# Patient Record
Sex: Female | Born: 1980 | Race: White | Hispanic: No | Marital: Single | Smoking: Current every day smoker
Health system: Southern US, Community
[De-identification: ages and names within clinical notes are randomized; demographics above are authoritative.]

## PROBLEM LIST (undated history)

## (undated) DIAGNOSIS — F419 Anxiety disorder, unspecified: Secondary | ICD-10-CM

## (undated) DIAGNOSIS — F32A Depression, unspecified: Secondary | ICD-10-CM

## (undated) DIAGNOSIS — N83209 Unspecified ovarian cyst, unspecified side: Secondary | ICD-10-CM

## (undated) DIAGNOSIS — F988 Other specified behavioral and emotional disorders with onset usually occurring in childhood and adolescence: Secondary | ICD-10-CM

## (undated) DIAGNOSIS — F329 Major depressive disorder, single episode, unspecified: Secondary | ICD-10-CM

## (undated) HISTORY — PX: APPENDECTOMY: SHX54

## (undated) HISTORY — PX: CERVICAL BIOPSY  W/ LOOP ELECTRODE EXCISION: SUR135

---

## 2007-09-17 ENCOUNTER — Inpatient Hospital Stay: Payer: Self-pay | Admitting: General Surgery

## 2007-12-08 ENCOUNTER — Emergency Department: Payer: Self-pay | Admitting: Emergency Medicine

## 2008-04-25 ENCOUNTER — Emergency Department: Payer: Self-pay | Admitting: Emergency Medicine

## 2015-09-06 ENCOUNTER — Encounter: Payer: Self-pay | Admitting: Emergency Medicine

## 2015-09-06 ENCOUNTER — Emergency Department: Payer: Self-pay

## 2015-09-06 ENCOUNTER — Emergency Department
Admission: EM | Admit: 2015-09-06 | Discharge: 2015-09-06 | Disposition: A | Discharge status: Home / Self Care | Payer: Self-pay | Attending: Emergency Medicine | Admitting: Emergency Medicine

## 2015-09-06 DIAGNOSIS — Z720 Tobacco use: Secondary | ICD-10-CM | POA: Insufficient documentation

## 2015-09-06 DIAGNOSIS — R0789 Other chest pain: Secondary | ICD-10-CM | POA: Insufficient documentation

## 2015-09-06 DIAGNOSIS — R079 Chest pain, unspecified: Secondary | ICD-10-CM

## 2015-09-06 DIAGNOSIS — R0602 Shortness of breath: Secondary | ICD-10-CM | POA: Insufficient documentation

## 2015-09-06 LAB — BASIC METABOLIC PANEL
BUN: 13 mg/dL (ref 6–20)
CALCIUM: 9.3 mg/dL (ref 8.9–10.3)
CHLORIDE: 104 mmol/L (ref 101–111)
CO2: 25 mmol/L (ref 22–32)
CREATININE: 0.64 mg/dL (ref 0.44–1.00)
GFR calc Af Amer: >60 mL/min (ref >60)
GFR calc non Af Amer: >60 mL/min (ref >60)
Glucose, Bld: 89 mg/dL (ref 65–99)
Potassium: 4.5 mmol/L (ref 3.5–5.1)
SODIUM: UNDETERMINED mmol/L (ref 135–145)

## 2015-09-06 LAB — CBC
HCT: 39.5 % (ref 35.0–47.0)
Hemoglobin: 14.2 g/dL (ref 12.0–16.0)
MCH: 35.4 pg — AB (ref 26.0–34.0)
MCHC: 35.8 g/dL (ref 32.0–36.0)
MCV: 98.8 fL (ref 80.0–100.0)
PLATELETS: 249 10*3/uL (ref 150–440)
RBC: 4 MIL/uL (ref 3.80–5.20)
RDW: 12.7 % (ref 11.5–14.5)
WBC: 8.6 10*3/uL (ref 3.6–11.0)

## 2015-09-06 LAB — TROPONIN I: Troponin I: <0.03 ng/mL (ref <0.031)

## 2015-09-06 NOTE — ED Notes (Signed)
She is to be discharged to home

## 2015-09-06 NOTE — ED Notes (Signed)
SAYS CHEST PAIN WORSE YESTERDAY PRESSURE.  TODAY IT IS STILL THERE AND IT CAUSES SOB.

## 2015-09-06 NOTE — ED Provider Notes (Signed)
[facility] Emergency Department Provider Note     Time seen: ----------------------------------------- 11:25 AM on 09/06/2015 -----------------------------------------    I have reviewed the triage vital signs and the nursing notes.   HISTORY  Chief Complaint Chest Pain    HPI Christine Allison is a 34 y.o. female who presents ER for chest pain is worse since yesterday. Describes the pain as pressure, today the pain is still there and it caused shortness of breath.She has had associated nausea. Symptoms occurred while she was at work today, last episode was 2 weeks ago. Prior to that she has never had any chest pain or pressure before. She denies any recent illness. Nothing seems to make it better, landed flat makes her symptoms worse.   History reviewed. No pertinent past medical history.  There are no active problems to display for this patient.   No past surgical history on file.  Allergies Review of patient's allergies indicates no known allergies.  Social History Social History  Substance Use Topics  . Smoking status: Current Every Day Smoker  . Smokeless tobacco: None  . Alcohol Use: Yes    Review of Systems Constitutional: Negative for fever. Eyes: Negative for visual changes. ENT: Negative for sore throat. Cardiovascular: Positive for chest pain Respiratory: Positive for shortness of breath Gastrointestinal: Negative for abdominal pain, vomiting and diarrhea. Genitourinary: Negative for dysuria. Musculoskeletal: Negative for back pain. Skin: Negative for rash. Neurological: Negative for headaches, focal weakness or numbness.  10-point ROS otherwise negative.  ____________________________________________   PHYSICAL EXAM:  VITAL SIGNS: ED Triage Vitals  Enc Vitals Group     BP 09/06/15 1023 122/71 mmHg     Pulse Rate 09/06/15 1023 81     Resp 09/06/15 1023 16     Temp 09/06/15 1023 98.1 F (36.7 C)     Temp Source  09/06/15 1023 Oral     SpO2 09/06/15 1023 97 %     Weight 09/06/15 1023 160 lb (72.576 kg)     Height 09/06/15 1023  (1.702 m)     Head Cir --      Peak Flow --      Pain Score 09/06/15 1024 4     Pain Loc --      Pain Edu? --      Excl. in GC? --     Constitutional: Alert and oriented. Well appearing and in no distress. Eyes: Conjunctivae are normal. PERRL. Normal extraocular movements. ENT   Head: Normocephalic and atraumatic.   Nose: No congestion/rhinnorhea.   Mouth/Throat: Mucous membranes are moist.   Neck: No stridor. Cardiovascular: Normal rate, regular rhythm. Normal and symmetric distal pulses are present in all extremities. No murmurs, rubs, or gallops. Respiratory: Normal respiratory effort without tachypnea nor retractions. Breath sounds are clear and equal bilaterally. No wheezes/rales/rhonchi. Gastrointestinal: Soft and nontender. No distention. No abdominal bruits.  Musculoskeletal: Nontender with normal range of motion in all extremities. No joint effusions.  No lower extremity tenderness nor edema. Neurologic:  Normal speech and language. No gross focal neurologic deficits are appreciated. Speech is normal. No gait instability. Skin:  Skin is warm, dry and intact. No rash noted. Psychiatric: Mood and affect are normal. Speech and behavior are normal. Patient exhibits appropriate insight and judgment. ____________________________________________  EKG: Interpreted by me. Sinus rhythm with normal axis normal intervals. No evidence of hypertrophy or acute infarction. Rate is 80 bpm  ____________________________________________  ED COURSE:  Pertinent labs & imaging results that were available  during my care of the patient were reviewed by me and considered in my medical decision making (see chart for details).  ____________________________________________    LABS (pertinent positives/negatives)  Labs Reviewed  CBC - Abnormal; Notable for the  following:    MCH 35.4 (*)    All other components within normal limits  BASIC METABOLIC PANEL  TROPONIN I    RADIOLOGY Chest x-ray is unremarkable  ____________________________________________  FINAL ASSESSMENT AND PLAN  Chest pain  Plan: Patient with labs and imaging as dictated above. No clear explanation for her symptoms, she is stable for outpatient follow-up with her doctor. Low risk for ACS.   Emily Filbert, MD   Emily Filbert, MD 09/06/15 [phone]

## 2015-09-06 NOTE — Discharge Instructions (Signed)

## 2015-09-06 NOTE — ED Notes (Signed)
I have reviewed the discharge instructions with her and she verbalized agreement and understanding

## 2015-11-04 ENCOUNTER — Emergency Department
Admission: EM | Admit: 2015-11-04 | Discharge: 2015-11-04 | Disposition: A | Discharge status: Home / Self Care | Payer: Self-pay | Attending: Emergency Medicine | Admitting: Emergency Medicine

## 2015-11-04 ENCOUNTER — Encounter: Payer: Self-pay | Admitting: Emergency Medicine

## 2015-11-04 DIAGNOSIS — Y990 Civilian activity done for income or pay: Secondary | ICD-10-CM | POA: Insufficient documentation

## 2015-11-04 DIAGNOSIS — S61012A Laceration without foreign body of left thumb without damage to nail, initial encounter: Secondary | ICD-10-CM

## 2015-11-04 DIAGNOSIS — W260XXA Contact with knife, initial encounter: Secondary | ICD-10-CM | POA: Insufficient documentation

## 2015-11-04 DIAGNOSIS — Y9389 Activity, other specified: Secondary | ICD-10-CM | POA: Insufficient documentation

## 2015-11-04 DIAGNOSIS — Z23 Encounter for immunization: Secondary | ICD-10-CM | POA: Insufficient documentation

## 2015-11-04 DIAGNOSIS — F17200 Nicotine dependence, unspecified, uncomplicated: Secondary | ICD-10-CM | POA: Insufficient documentation

## 2015-11-04 DIAGNOSIS — Y9289 Other specified places as the place of occurrence of the external cause: Secondary | ICD-10-CM | POA: Insufficient documentation

## 2015-11-04 DIAGNOSIS — S61112A Laceration without foreign body of left thumb with damage to nail, initial encounter: Secondary | ICD-10-CM | POA: Insufficient documentation

## 2015-11-04 HISTORY — DX: Depression, unspecified: F32.A

## 2015-11-04 HISTORY — DX: Anxiety disorder, unspecified: F41.9

## 2015-11-04 HISTORY — DX: Major depressive disorder, single episode, unspecified: F32.9

## 2015-11-04 HISTORY — DX: Unspecified ovarian cyst, unspecified side: N83.209

## 2015-11-04 MED ORDER — TETANUS-DIPHTH-ACELL PERTUSSIS 5-2.5-18.5 LF-MCG/0.5 IM SUSP
0.5000 mL | Freq: Once | INTRAMUSCULAR | Status: AC
  Administered 2015-11-04: 0.5 mL via INTRAMUSCULAR
  Filled 2015-11-04: qty 0.5

## 2015-11-04 NOTE — Discharge Instructions (Signed)
Wound Care °Taking care of your wound properly can help to prevent pain and infection. It can also help your wound to heal more quickly.  °HOW TO CARE FOR YOUR WOUND  °· Take or apply over-the-counter and prescription medicines only as told by your health care provider. °· If you were prescribed antibiotic medicine, take or apply it as told by your health care provider. Do not stop using the antibiotic even if your condition improves. °· Clean the wound each day or as told by your health care provider. °¨ Wash the wound with mild soap and water. °¨ Rinse the wound with water to remove all soap. °¨ Pat the wound dry with a clean towel. Do not rub it. °· There are many different ways to close and cover a wound. For example, a wound can be covered with stitches (sutures), skin glue, or adhesive strips. Follow instructions from your health care provider about: °¨ How to take care of your wound. °¨ When and how you should change your bandage (dressing). °¨ When you should remove your dressing. °¨ Removing whatever was used to close your wound. °· Check your wound every day for signs of infection. Watch for: °¨ Redness, swelling, or pain. °¨ Fluid, blood, or pus. °· Keep the dressing dry until your health care provider says it can be removed. Do not take baths, swim, use a hot tub, or do anything that would put your wound underwater until your health care provider approves. °· Raise (elevate) the injured area above the level of your heart while you are sitting or lying down. °· Do not scratch or pick at the wound. °· Keep all follow-up visits as told by your health care provider. This is important. °SEEK MEDICAL CARE IF: °· You received a tetanus shot and you have swelling, severe pain, redness, or bleeding at the injection site. °· You have a fever. °· Your pain is not controlled with medicine. °· You have increased redness, swelling, or pain at the site of your wound. °· You have fluid, blood, or pus coming from your  wound. °· You notice a bad smell coming from your wound or your dressing. °SEEK IMMEDIATE MEDICAL CARE IF: °· You have a red streak going away from your wound. °  °This information is not intended to replace advice given to you by your health care provider. Make sure you discuss any questions you have with your health care provider. °  °Document Released: 09/09/2008 Document Revised: 04/17/2015 Document Reviewed: 11/27/2014 °Elsevier Interactive Patient Education ©2016 Elsevier Inc. ° °

## 2015-11-04 NOTE — ED Notes (Signed)
Pt was at work and was cutting broccoli and sliced left thumb. Pt works for [employer] and spoke with Nick SwazilandJordan @ [phone] and he denies need for UDS at this time. Pt does want to file this as a workman's comp claim.

## 2015-11-04 NOTE — ED Provider Notes (Signed)
[facility] Emergency Department Provider Note  ____________________________________________  Time seen: 2025  I have reviewed the triage vital signs and the nursing notes.   HISTORY  Chief Complaint Laceration     HPI Christine Allison is a 34 y.o. female who was cutting broccoli at work. The knife sliced through a very small portion of her distal left thumb. This small area, approximately 4 mm in diameter, cut a small circle off. She has ongoing discomfort and pain. She reports that the blood was slow to stop. There is no bleeding at this time. She does not recall when her last tetanus shot was and she is requesting a tetanus shot now.   Past Medical History  Diagnosis Date  . Ovarian cyst   . Anxiety   . Depression     There are no active problems to display for this patient.   Past Surgical History  Procedure Laterality Date  . Appendectomy      No current outpatient prescriptions on file.  Allergies Review of patient's allergies indicates no known allergies.  History reviewed. No pertinent family history.  Social History Social History  Substance Use Topics  . Smoking status: Current Every Day Smoker  . Smokeless tobacco: None  . Alcohol Use: Yes    Review of Systems  Constitutional: Negative for fatigue. ENT: Negative for congestion. Cardiovascular: Negative for chest pain. Respiratory: Negative for cough. Gastrointestinal: Negative for abdominal pain, vomiting and diarrhea. Genitourinary: Negative for dysuria. Musculoskeletal: No myalgias or injuries. Skin: Laceration left thumb. See history of present illness. Neurological: Negative for headache or focal weakness   10-point ROS otherwise negative.  ____________________________________________   PHYSICAL EXAM:  VITAL SIGNS: ED Triage Vitals  Enc Vitals Group     BP 11/04/15 1959 131/71 mmHg     Pulse Rate 11/04/15 1959 87     Resp 11/04/15 1959 18     Temp 11/04/15  1959 98.2 F (36.8 C)     Temp Source 11/04/15 1959 Oral     SpO2 11/04/15 1959 97 %     Weight 11/04/15 1959 161 lb (73.029 kg)     Height 11/04/15 1959 5\' 7"  (1.702 m)     Head Cir --      Peak Flow --      Pain Score 11/04/15 1955 4     Pain Loc --      Pain Edu? --      Excl. in GC? --     Constitutional:  Alert and oriented. Pleasant and interactive. Some discomfort with the left thumb. ENT   Head: Normocephalic and atraumatic. Cardiovascular: Normal rate, regular rhythm, no murmur noted Respiratory:  Normal respiratory effort, no tachypnea.    Breath sounds are clear and equal bilaterally.  Musculoskeletal: No deformity noted. Nontender with normal range of motion in all extremities, including left thumb.  No noted edema. Neurologic:  Communicative. Normal appearing spontaneous movement in all 4 extremities. No gross focal neurologic deficits are appreciated.  Skin:  Skin is warm, dry. There is a 4 mm small circular excision of dermis on the distal portion of the left thumb on the lateral edge by the nail. At this time, the bleeding is controlled. The rest of the thumb did have a thin layer of dried blood on it which we have now cleaned off. Psychiatric: Mood and affect are normal. Speech and behavior are normal.  ____________________________________________   INITIAL IMPRESSION / ASSESSMENT AND PLAN / ED COURSE  Pertinent  labs & imaging results that were available during my care of the patient were reviewed by me and considered in my medical decision making (see chart for details).  34 year old female with a small circular excision of dermal tissue. Her bleeding was slow to stop prior to arrival, but on exam, the 4 mm excised area looks good. We will treat her with tetanus. I have counseled her on wound care. We will place a dressing on the area.  ____________________________________________   FINAL CLINICAL IMPRESSION(S) / ED DIAGNOSES  Final diagnoses:  Laceration of  left thumb, initial encounter        Darien Ramus, MD 11/04/15 2103

## 2015-11-04 NOTE — ED Notes (Signed)
Pt d/c to home with significant other. Pt is ambulatory and alert at this time. 

## 2016-05-08 ENCOUNTER — Emergency Department
Admission: EM | Admit: 2016-05-08 | Discharge: 2016-05-08 | Disposition: A | Discharge status: Home / Self Care | Payer: Self-pay | Attending: Emergency Medicine | Admitting: Emergency Medicine

## 2016-05-08 ENCOUNTER — Encounter: Payer: Self-pay | Admitting: Emergency Medicine

## 2016-05-08 DIAGNOSIS — F17200 Nicotine dependence, unspecified, uncomplicated: Secondary | ICD-10-CM | POA: Insufficient documentation

## 2016-05-08 DIAGNOSIS — Z79899 Other long term (current) drug therapy: Secondary | ICD-10-CM | POA: Insufficient documentation

## 2016-05-08 DIAGNOSIS — Z760 Encounter for issue of repeat prescription: Secondary | ICD-10-CM | POA: Insufficient documentation

## 2016-05-08 DIAGNOSIS — F329 Major depressive disorder, single episode, unspecified: Secondary | ICD-10-CM | POA: Insufficient documentation

## 2016-05-08 MED ORDER — DULOXETINE HCL 60 MG PO CPEP: 60.0000 mg | ORAL_CAPSULE | Freq: Every day | ORAL | Status: DC

## 2016-05-08 NOTE — ED Notes (Signed)
See triage note  States she is out of both meds  But states she needed cymbalta refilled if possible   Was told to come to ED per [facility]

## 2016-05-08 NOTE — ED Provider Notes (Signed)
[facility] Emergency Department Provider Note ____________________________________________  Time seen: 1738  I have reviewed the triage vital signs and the nursing notes.  HISTORY  Chief Complaint  Medication Refill  HPI Christine Allison is a 35 y.o. female presents to the ED for refill of her Cymbalta. She describes that she is followed by our [facility]for monthly refills. She has now dosed her last pill yesterday, and was referred to the ED when the called [facility] for a refill. She denies withdrawal symptoms, suicidal or homicidal ideations. She is [facility].   Past Medical History  Diagnosis Date  . Ovarian cyst   . Anxiety   . Depression     There are no active problems to display for this patient.   Past Surgical History  Procedure Laterality Date  . Appendectomy      Current Outpatient Rx  Name  Route  Sig  Dispense  Refill  . DULoxetine (CYMBALTA) 60 MG capsule   Oral   Take 60 mg by mouth daily.         . methylphenidate (RITALIN) 5 MG tablet   Oral   Take 15 mg by mouth 2 (two) times daily with breakfast and lunch.         . DULoxetine (CYMBALTA) 60 MG capsule   Oral   Take 1 capsule (60 mg total) by mouth daily.   7 capsule   0     Allergies Review of patient's allergies indicates no known allergies.  History reviewed. No pertinent family history.  Social History Social History  Substance Use Topics  . Smoking status: Current Every Day Smoker  . Smokeless tobacco: Never Used  . Alcohol Use: Yes    Review of Systems  Constitutional: Negative for fever. Cardiovascular: Negative for chest pain. Respiratory: Negative for shortness of breath. Gastrointestinal: Negative for abdominal pain, vomiting and diarrhea. Neurological: Negative for headaches, focal weakness or numbness. ____________________________________________  PHYSICAL EXAM:  VITAL SIGNS: ED Triage Vitals   Enc Vitals Group     BP 05/08/16 1712 116/62 mmHg     Pulse Rate 05/08/16 1712 82     Resp 05/08/16 1712 18     Temp 05/08/16 1712 98.1 F (36.7 C)     Temp Source 05/08/16 1712 Oral     SpO2 05/08/16 1712 98 %     Weight 05/08/16 1712 166 lb (75.297 kg)     Height 05/08/16 1712 5\' 7"  (1.702 m)     Head Cir --      Peak Flow --      Pain Score --      Pain Loc --      Pain Edu? --      Excl. in GC? --    Constitutional: Alert and oriented. Well appearing and in no distress. Head: Normocephalic and atraumatic. Eyes: Conjunctivae are normal. PERRL. Normal extraocular movements Cardiovascular: Normal rate, regular rhythm.  Respiratory: Normal respiratory effort. No wheezes/rales/rhonchi. Musculoskeletal: Nontender with normal range of motion in all extremities.  Neurologic:  Normal gait without ataxia. Normal speech and language. No gross focal neurologic deficits are appreciated. Skin:  Skin is warm, dry and intact. No rash noted. Psychiatric: Mood and affect are normal. Patient exhibits appropriate insight and judgment. ____________________________________________  INITIAL IMPRESSION / ASSESSMENT AND PLAN / ED COURSE  Patient with a refill request for her daily Cymbalta. She is between appointments as [facility] She will  be provided with #7 Cymbalta 60 mg CR tabs. She will follow-up with [facility] on May 30th as scheduled.  ____________________________________________  FINAL CLINICAL IMPRESSION(S) / ED DIAGNOSES  Final diagnoses:  Medication refill     Lissa Hoard, PA-C 05/08/16 1756  Phineas Semen, MD 05/09/16 [phone]

## 2016-05-08 NOTE — ED Notes (Addendum)
Pt presents to ED for Cymbalta 60mg . Pt states she had her last dose yesterday morning at approx 0600. Pt c/o anxiety, dizziness, vertigo. Pt states was told to come in by [facility] due to "withdrawal from her medication". Pt in NAD at this time, pt calm and cooperative, respirations even and unlabored a this time.

## 2016-05-08 NOTE — Discharge Instructions (Signed)
Medicine Refill at the Emergency Department We have refilled your medicine today, but it is best for you to get refills through your primary health care provider's office. In the future, please plan ahead so you do not need to get refills from the emergency department. If the medicine we refilled was a maintenance medicine, you may have received only enough to get you by until you are able to see your regular health care provider.   This information is not intended to replace advice given to you by your health care provider. Make sure you discuss any questions you have with your health care provider.   Document Released: 03/19/2004 Document Revised: 12/22/2014 Document Reviewed: 03/10/2014 Elsevier Interactive Patient Education 2016 [employer]  You should follow-up with [facility] For your medicine refill as scheduled.

## 2016-05-29 ENCOUNTER — Telehealth: Payer: Self-pay | Admitting: *Deleted

## 2016-05-29 NOTE — Telephone Encounter (Signed)
Received referral from Dr. Cain Saupeammie Fulp for spontaneous ecchymosis. [place] in [place]. Left VM to call HIM to discuss if she prefers to [facility]. No labs since 09/06/15 and no office note from referring provider. Left VM for [facility] at [facility] requesting records-office notes/labs.

## 2016-05-29 NOTE — Telephone Encounter (Addendum)
Called patient back and made her aware that she was contacted in error-wrong [employer]elly Allison as well as [facility] to retract request for records.

## 2016-06-28 ENCOUNTER — Emergency Department
Admission: EM | Admit: 2016-06-28 | Discharge: 2016-06-28 | Disposition: A | Discharge status: Home / Self Care | Payer: Self-pay | Attending: Emergency Medicine | Admitting: Emergency Medicine

## 2016-06-28 ENCOUNTER — Encounter: Payer: Self-pay | Admitting: Emergency Medicine

## 2016-06-28 DIAGNOSIS — S61519A Laceration without foreign body of unspecified wrist, initial encounter: Secondary | ICD-10-CM

## 2016-06-28 DIAGNOSIS — S51811A Laceration without foreign body of right forearm, initial encounter: Secondary | ICD-10-CM | POA: Insufficient documentation

## 2016-06-28 DIAGNOSIS — Y999 Unspecified external cause status: Secondary | ICD-10-CM | POA: Insufficient documentation

## 2016-06-28 DIAGNOSIS — S51812A Laceration without foreign body of left forearm, initial encounter: Secondary | ICD-10-CM | POA: Insufficient documentation

## 2016-06-28 DIAGNOSIS — Z79899 Other long term (current) drug therapy: Secondary | ICD-10-CM | POA: Insufficient documentation

## 2016-06-28 DIAGNOSIS — Z7289 Other problems related to lifestyle: Secondary | ICD-10-CM

## 2016-06-28 DIAGNOSIS — X789XXA Intentional self-harm by unspecified sharp object, initial encounter: Secondary | ICD-10-CM

## 2016-06-28 DIAGNOSIS — X788XXA Intentional self-harm by other sharp object, initial encounter: Secondary | ICD-10-CM | POA: Insufficient documentation

## 2016-06-28 DIAGNOSIS — Y92009 Unspecified place in unspecified non-institutional (private) residence as the place of occurrence of the external cause: Secondary | ICD-10-CM | POA: Insufficient documentation

## 2016-06-28 DIAGNOSIS — F1010 Alcohol abuse, uncomplicated: Secondary | ICD-10-CM

## 2016-06-28 DIAGNOSIS — F329 Major depressive disorder, single episode, unspecified: Secondary | ICD-10-CM | POA: Insufficient documentation

## 2016-06-28 DIAGNOSIS — F17210 Nicotine dependence, cigarettes, uncomplicated: Secondary | ICD-10-CM | POA: Insufficient documentation

## 2016-06-28 DIAGNOSIS — F32A Depression, unspecified: Secondary | ICD-10-CM

## 2016-06-28 DIAGNOSIS — F3131 Bipolar disorder, current episode depressed, mild: Secondary | ICD-10-CM

## 2016-06-28 DIAGNOSIS — Y939 Activity, unspecified: Secondary | ICD-10-CM | POA: Insufficient documentation

## 2016-06-28 DIAGNOSIS — F319 Bipolar disorder, unspecified: Secondary | ICD-10-CM

## 2016-06-28 LAB — CBC WITH DIFFERENTIAL/PLATELET
BASOS ABS: 0.1 10*3/uL (ref 0–0.1)
Basophils Relative: 1 %
Eosinophils Absolute: 0.5 10*3/uL (ref 0–0.7)
HCT: 42 % (ref 35.0–47.0)
HEMOGLOBIN: 15 g/dL (ref 12.0–16.0)
Lymphs Abs: 3.3 10*3/uL (ref 1.0–3.6)
MCH: 34.6 pg — ABNORMAL HIGH (ref 26.0–34.0)
MCHC: 35.8 g/dL (ref 32.0–36.0)
MCV: 96.6 fL (ref 80.0–100.0)
Monocytes Absolute: 0.9 10*3/uL (ref 0.2–0.9)
Monocytes Relative: 8 %
NEUTROS ABS: 6.7 10*3/uL — AB (ref 1.4–6.5)
Platelets: 252 10*3/uL (ref 150–440)
RBC: 4.35 MIL/uL (ref 3.80–5.20)
RDW: 12.9 % (ref 11.5–14.5)
WBC: 11.4 10*3/uL — AB (ref 3.6–11.0)

## 2016-06-28 LAB — URINE DRUG SCREEN, QUALITATIVE (ARMC ONLY)
AMPHETAMINES, UR SCREEN: NOT DETECTED
BENZODIAZEPINE, UR SCRN: NOT DETECTED
Barbiturates, Ur Screen: NOT DETECTED
Cannabinoid 50 Ng, Ur ~~LOC~~: NOT DETECTED
Cocaine Metabolite,Ur ~~LOC~~: NOT DETECTED
MDMA (ECSTASY) UR SCREEN: NOT DETECTED
METHADONE SCREEN, URINE: NOT DETECTED
Opiate, Ur Screen: NOT DETECTED
PHENCYCLIDINE (PCP) UR S: NOT DETECTED
TRICYCLIC, UR SCREEN: NOT DETECTED

## 2016-06-28 LAB — COMPREHENSIVE METABOLIC PANEL
ALT: 55 U/L — ABNORMAL HIGH (ref 14–54)
ANION GAP: 9 (ref 5–15)
AST: 40 U/L (ref 15–41)
Albumin: 4.4 g/dL (ref 3.5–5.0)
Alkaline Phosphatase: 81 U/L (ref 38–126)
BUN: 12 mg/dL (ref 6–20)
CHLORIDE: 106 mmol/L (ref 101–111)
CO2: 20 mmol/L — ABNORMAL LOW (ref 22–32)
CREATININE: 0.74 mg/dL (ref 0.44–1.00)
Calcium: 9.3 mg/dL (ref 8.9–10.3)
GFR calc Af Amer: >60 mL/min (ref >60)
Glucose, Bld: 103 mg/dL — ABNORMAL HIGH (ref 65–99)
POTASSIUM: 3.5 mmol/L (ref 3.5–5.1)
SODIUM: 135 mmol/L (ref 135–145)
Total Bilirubin: 0.6 mg/dL (ref 0.3–1.2)
Total Protein: 7.8 g/dL (ref 6.5–8.1)

## 2016-06-28 LAB — POCT PREGNANCY, URINE: PREG TEST UR: NEGATIVE

## 2016-06-28 LAB — SALICYLATE LEVEL: Salicylate Lvl: <4 mg/dL (ref 2.8–30.0)

## 2016-06-28 LAB — PREGNANCY, URINE: PREG TEST UR: NEGATIVE

## 2016-06-28 LAB — ETHANOL: ALCOHOL ETHYL (B): 212 mg/dL — AB (ref <5)

## 2016-06-28 LAB — ACETAMINOPHEN LEVEL

## 2016-06-28 MED ORDER — SODIUM CHLORIDE 0.9 % IV BOLUS (SEPSIS)
1000.0000 mL | Freq: Once | INTRAVENOUS | Status: AC
  Administered 2016-06-28: 1000 mL via INTRAVENOUS

## 2016-06-28 MED ORDER — METHYLPHENIDATE HCL 10 MG PO TABS
15.0000 mg | ORAL_TABLET | Freq: Two times a day (BID) | ORAL | Status: DC
  Administered 2016-06-28: 15 mg via ORAL
  Filled 2016-06-28: qty 2

## 2016-06-28 MED ORDER — DULOXETINE HCL 60 MG PO CPEP
60.0000 mg | ORAL_CAPSULE | Freq: Every day | ORAL | Status: DC
  Administered 2016-06-28: 60 mg via ORAL
  Filled 2016-06-28: qty 1

## 2016-06-28 NOTE — ED Notes (Signed)
TTS at bedside. 

## 2016-06-28 NOTE — Progress Notes (Signed)
Spoke with Nurse [facility] to order cart for telpsych TTS assessment, pt. Nurse notified will wake pt. Up and place cart in room. Pt nurse is Felicia. Patra Gherardi K. Sherlon HandingHarris, LCAS-A, LPC-A, [facility]  Counselor 06/28/2016 7:45 AM

## 2016-06-28 NOTE — Discharge Instructions (Signed)
You have been seen in the Emergency Department (ED)  today for a psychiatric complaint.  You have been evaluated by psychiatry and we believe you are safe to be discharged from the hospital.   ° °Please return to the Emergency Department (ED)  immediately if you have ANY thoughts of hurting yourself or anyone else, so that we may help you. ° °Please avoid alcohol and drug use. ° °Follow up with your doctor and/or therapist as soon as possible regarding today's ED  visit.  ° °You may call crisis hotline for [place] at [phone]. ° °
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
## 2016-06-28 NOTE — ED Provider Notes (Signed)
[facility] Emergency Department Provider Note   ____________________________________________  Time seen: Approximately 3:30 AM  I have reviewed the triage vital signs and the nursing notes.   HISTORY  Chief Complaint Depression and Alcohol Intoxication    HPI Christine Allison is a 35 y.o. female who presents to the ED from home via EMS with chief complaint of alcohol intoxication and self injurious behavior. Patient has a history of anxiety and depression who reports home stressors this morning which led to her cutting both forearms with a dull razor. Tetanus is up-to-date. Patient reports alcohol use prior to arrival. Denies SI/HI/AH/VH. Voices no medical complaints. Denies recent fever, chills, chest pain, shortness of breath, abdominal pain, nausea, vomiting, diarrhea. Denies recent travel or trauma. Nothing makes her symptoms better or worse.   Past Medical History  Diagnosis Date  . Ovarian cyst   . Anxiety   . Depression     There are no active problems to display for this patient.   Past Surgical History  Procedure Laterality Date  . Appendectomy      Current Outpatient Rx  Name  Route  Sig  Dispense  Refill  . DULoxetine (CYMBALTA) 60 MG capsule   Oral   Take 1 capsule (60 mg total) by mouth daily.   7 capsule   0   . methylphenidate (RITALIN) 5 MG tablet   Oral   Take 15 mg by mouth 2 (two) times daily with breakfast and lunch.           Allergies Review of patient's allergies indicates no known allergies.  History reviewed. No pertinent family history.  Social History Social History  Substance Use Topics  . Smoking status: Current Every Day Smoker -- 0.50 packs/day    Types: Cigarettes  . Smokeless tobacco: Never Used  . Alcohol Use: Yes     Comment: 2-3 beers dailey    Review of Systems  Constitutional: No fever/chills. Eyes: No visual changes. ENT: No sore throat. Cardiovascular: Denies chest  pain. Respiratory: Denies shortness of breath. Gastrointestinal: No abdominal pain.  No nausea, no vomiting.  No diarrhea.  No constipation. Genitourinary: Negative for dysuria. Musculoskeletal: Positive for bilateral forearm lacerations. Negative for back pain. Skin: Negative for rash. Neurological: Negative for headaches, focal weakness or numbness. Psychiatric:Positive for anxiety and depression.  10-point ROS otherwise negative.  ____________________________________________   PHYSICAL EXAM:  VITAL SIGNS: ED Triage Vitals  Enc Vitals Group     BP 06/28/16 0230 119/83 mmHg     Pulse Rate 06/28/16 0230 95     Resp 06/28/16 0230 20     Temp 06/28/16 0230 98.8 F (37.1 C)     Temp Source 06/28/16 0230 Oral     SpO2 06/28/16 0230 97 %     Weight 06/28/16 0230 165 lb (74.844 kg)     Height 06/28/16 0230 5\' 8"  (1.727 m)     Head Cir --      Peak Flow --      Pain Score 06/28/16 0234 2     Pain Loc --      Pain Edu? --      Excl. in GC? --     Constitutional: Asleep, reluctant to awakened for exam. Well appearing and in no acute distress. Eyes: Conjunctivae are normal. PERRL. EOMI. Head: Atraumatic. Nose: No congestion/rhinnorhea. Mouth/Throat: Mucous membranes are moist.  Oropharynx non-erythematous. Neck: No stridor.  No cervical spine tenderness to palpation. Cardiovascular: Normal rate, regular rhythm. Grossly  normal heart sounds.  Good peripheral circulation. Respiratory: Normal respiratory effort.  No retractions. Lungs CTAB. Gastrointestinal: Soft and nontender. No distention. No abdominal bruits. No CVA tenderness. Musculoskeletal: Bilateral forearms: Multiple linear superficial lacerations to both forearms not requiring sutures. Neurologic:  Normal speech and language. No gross focal neurologic deficits are appreciated.  Skin:  Skin is warm, dry and intact. No rash noted. Psychiatric: Mood and affect are flat. Speech and behavior are  flat.  ____________________________________________   LABS (all labs ordered are listed, but only abnormal results are displayed)  Labs Reviewed  COMPREHENSIVE METABOLIC PANEL - Abnormal; Notable for the following:    CO2 20 (*)    Glucose, Bld 103 (*)    ALT 55 (*)    All other components within normal limits  ETHANOL - Abnormal; Notable for the following:    Alcohol, Ethyl (B) 212 (*)    All other components within normal limits  CBC WITH DIFFERENTIAL/PLATELET - Abnormal; Notable for the following:    WBC 11.4 (*)    MCH 34.6 (*)    Neutro Abs 6.7 (*)    All other components within normal limits  ACETAMINOPHEN LEVEL - Abnormal; Notable for the following:    Acetaminophen (Tylenol), Serum <10 (*)    All other components within normal limits  SALICYLATE LEVEL  URINE DRUG SCREEN, QUALITATIVE ([facility] ONLY)  PREGNANCY, URINE  POCT PREGNANCY, URINE   ____________________________________________  EKG  None ____________________________________________  RADIOLOGY  None ____________________________________________   PROCEDURES  Procedure(s) performed: None  Procedures  Critical Care performed: No  ____________________________________________   INITIAL IMPRESSION / ASSESSMENT AND PLAN / ED COURSE  Pertinent labs & imaging results that were available during my care of the patient were reviewed by me and considered in my medical decision making (see chart for details).  35 year old female with a history of anxiety and depression who presents with alcohol intoxication and self injurious behavior. Voices no thoughts of SI/HI/AH/VH. Will initiate IV fluid resuscitation; consult TTS and psychiatry to evaluate patient in the emergency department. ____________________________________________   FINAL CLINICAL IMPRESSION(S) / ED DIAGNOSES  Final diagnoses:  Self-injurious behavior  Depression      NEW MEDICATIONS STARTED DURING THIS VISIT:  New Prescriptions   No  medications on file     Note:  This document was prepared using Dragon voice recognition software and may include unintentional dictation errors.    Irean Hong, MD 06/28/16 [phone]

## 2016-06-28 NOTE — BH Assessment (Signed)
Tele Assessment Note   Christine Allison is an 35 y.o. female, Caucasian, Single who presents to [facility] ED with complaint of stressors at work and home, and ongoing worsening depression. Patient primary concern is of current elevation in anxiety, multiple stressors, and Depression. Patient states that she lives with partner and has had a decline in sleep with normal being up to 10 hours per night, and most recent being 4-6 hours sleep per night.Patient denies hx. Of psychotic symptoms. Pt states that she is currently prescribed Zobolta and Rittalin.   Patient denies current SI, but has lacerations on arms / cuts stating that she dis this as a form of "acting out." Patient acknowledges hx. Of SI with x 1 attempt via overdose in 2013. Patient denies current or past hx. Of HI and AVH. Patient acknowledges hx. Of substance abuse with alcohol with age of start t 89 with daily consumption of unspecified amounts and last consumption on 06-27-16 with unknown amount. Patient acknowledges x 1 inpatient psychiatric admission in 2013 for overdose on pills. Patient acknowledges hx. Of outpatient psychiatric care with [facility] last visit at or around 5-17.  Patient denies hx. Of S.A. Inpatient treatment.   Patient is dressed in scrubs and is alert and oriented x4. Patient speech was within normal limits and motor behavior appeared normal. Patient thought process is coherent. Patient  does not appear to be responding to internal stimuli. Patient was cooperative throughout the assessment and states that she is agreeable to inpatient psychiatric treatment.  Diagnosis: Major Depressive Disorder, Recurrent Episode, Unspecified; Attention- Deficit/ Hyperactive Disorder; Alcohol Use Disorder, Severe  Past Medical History:  Past Medical History  Diagnosis Date  . Ovarian cyst   . Anxiety   . Depression     Past Surgical History  Procedure Laterality Date  . Appendectomy      Family History: History reviewed. No pertinent  family history.  Social History:  reports that she has been smoking Cigarettes.  She has been smoking about 0.50 packs per day. She has never used smokeless tobacco. She reports that she drinks alcohol. She reports that she does not use illicit drugs.  Additional Social History:  Alcohol / Drug Use Pain Medications: SEE MAR Prescriptions: SEE MAR Over the Counter: SEE MAR History of alcohol / drug use?: No history of alcohol / drug abuse Longest period of sobriety (when/how long): pt denies S.A.  CIWA: CIWA-Ar BP: 104/63 mmHg Pulse Rate: 84 Nausea and Vomiting: no nausea and no vomiting Tactile Disturbances: none Tremor: no tremor Auditory Disturbances: not present Paroxysmal Sweats: no sweat visible Visual Disturbances: not present Anxiety: no anxiety, at ease Headache, Fullness in Head: none present Agitation: normal activity Orientation and Clouding of Sensorium: oriented and can do serial additions CIWA-Ar Total: 0 COWS:    PATIENT STRENGTHS: (choose at least two) Active sense of humor Average or above average intelligence Capable of independent living  Allergies: No Known Allergies  Home Medications:  (Not in a hospital admission)  OB/GYN Status:  Patient's last menstrual period was 06/08/2016.  General Assessment Data Location of Assessment: [facility] Assessment Services TTS Assessment: In system Is this a Tele or Face-to-Face Assessment?: Tele Assessment Is this an Initial Assessment or a Re-assessment for this encounter?: Initial Assessment Marital status: Single Maiden name: n/a Is patient pregnant?: No Pregnancy Status: No Living Arrangements: Spouse/significant other Can pt return to current living arrangement?: Yes Admission Status: Voluntary Is patient capable of signing voluntary admission?: Yes Referral Source: Self/Family/Friend Insurance type: SP  Crisis Care Plan Living Arrangements: Spouse/significant other Name of Psychiatrist: [facility] Name of  Therapist: [facility]  Education Status Is patient currently in school?: No Current Grade: n/a Highest grade of school patient has completed: unspecified Name of school: n/a Contact person: Verdene Lennert [phone]  Risk to self with the past 6 months Suicidal Ideation: No Has patient been a risk to self within the past 6 months prior to admission? : No Suicidal Intent: No Has patient had any suicidal intent within the past 6 months prior to admission? : No Is patient at risk for suicide?: No Suicidal Plan?: No Has patient had any suicidal plan within the past 6 months prior to admission? : No Access to Means: No What has been your use of drugs/alcohol within the last 12 months?: alcohol Previous Attempts/Gestures: Yes How many times?: 1 Other Self Harm Risks: recent cut behavior superficial Triggers for Past Attempts: None known Intentional Self Injurious Behavior: Cutting (most recent but denies states was "acting out") Comment - Self Injurious Behavior:  (pt states was "acting out") Family Suicide History: No Recent stressful life event(s): Conflict (Comment) Persecutory voices/beliefs?: No Depression: Yes Depression Symptoms: Despondent, Insomnia, Tearfulness, Isolating, Fatigue, Guilt, Loss of interest in usual pleasures, Feeling worthless/self pity, Feeling angry/irritable Substance abuse history and/or treatment for substance abuse?: Yes Suicide prevention information given to non-admitted patients: Yes  Risk to Others within the past 6 months Homicidal Ideation: No Does patient have any lifetime risk of violence toward others beyond the six months prior to admission? : No Thoughts of Harm to Others: No Current Homicidal Intent: No Current Homicidal Plan: No Access to Homicidal Means: No Identified Victim: n/a History of harm to others?: No Assessment of Violence: None Noted Violent Behavior Description: none noted Does patient have access to weapons?: No Criminal  Charges Pending?: Yes Describe Pending Criminal Charges: pt. on probation for msidemeanor Does patient have a court date: Yes Court Date: 07/11/16 (pt did not specify date ) Is patient on probation?: Yes  Psychosis Hallucinations: None noted Delusions: None noted  Mental Status Report Appearance/Hygiene: Disheveled, In scrubs Eye Contact: Poor Motor Activity: Freedom of movement, Unremarkable Speech: Logical/coherent Level of Consciousness: Alert Mood: Depressed, Anxious, Despair Affect: Anxious, Depressed, Sad Anxiety Level: Moderate Thought Processes: Coherent, Relevant Judgement: Partial Orientation: Person, Place, Time, Situation, Appropriate for developmental age Obsessive Compulsive Thoughts/Behaviors: Moderate  Cognitive Functioning Concentration: Decreased Memory: Recent Intact, Remote Intact IQ: Average Insight: Fair Impulse Control: Poor Appetite: Fair Weight Loss: 0 Weight Gain: 0 Sleep: Decreased Total Hours of Sleep: 6 Vegetative Symptoms: None  ADLScreening [facility] Assessment Services) Patient's cognitive ability adequate to safely complete daily activities?: Yes Patient able to express need for assistance with ADLs?: Yes Independently performs ADLs?: Yes (appropriate for developmental age)  Prior Inpatient Therapy Prior Inpatient Therapy: Yes Prior Therapy Dates: 2013 Prior Therapy Facilty/Provider(s): [facility] Reason for Treatment: overdose  Prior Outpatient Therapy Prior Outpatient Therapy: Yes Prior Therapy Dates: 6-17 Prior Therapy Facilty/Provider(s): [facility] Reason for Treatment: depression/ anxiety Does patient have an ACCT team?: No Does patient have Intensive In-House Services?  : No Does patient have [facility] services? : No Does patient have P4CC services?: No  ADL Screening (condition at time of admission) Patient's cognitive ability adequate to safely complete daily activities?: Yes Is the patient deaf or have difficulty hearing?: No Does  the patient have difficulty seeing, even when wearing glasses/contacts?: Yes (pt states currently does not have glasses but needs them) Does the patient have difficulty concentrating, remembering, or  making decisions?: Yes (hx. ADHD) Patient able to express need for assistance with ADLs?: Yes Does the patient have difficulty dressing or bathing?: No Independently performs ADLs?: Yes (appropriate for developmental age) Does the patient have difficulty walking or climbing stairs?: No Weakness of Legs: None Weakness of Arms/Hands: None  Home Assistive Devices/Equipment Home Assistive Devices/Equipment: None    Abuse/Neglect Assessment (Assessment to be complete while patient is alone) Physical Abuse: Denies (2013) Verbal Abuse: Denies (2013) Sexual Abuse: Denies (2013) Exploitation of patient/patient's resources: Denies Self-Neglect: Denies Values / Beliefs Cultural Requests During Hospitalization: None Spiritual Requests During Hospitalization: None   Advance Directives (For Healthcare) Does patient have an advance directive?: No Would patient like information on creating an advanced directive?: No - patient declined information    Additional Information 1:1 In Past 12 Months?: No CIRT Risk: No Elopement Risk: No Does patient have medical clearance?: Yes     Disposition:  Disposition Initial Assessment Completed for this Encounter: Yes Disposition of Patient: Other dispositions (TBD upon psych/ extender consult)  Hipolito BayleyShean k Wyley Hack 06/28/2016 8:50 AM

## 2016-06-28 NOTE — ED Provider Notes (Signed)
Patient evaluated by psychiatry and cleared for discharge. Patient has counselor and a psychiatrist who she will follow-up with.  Nita Sicklearolina Gabby Rackers, MD 06/28/16 1348

## 2016-06-28 NOTE — Consult Note (Signed)
[facility] Consult   Reason for Consult:  Consult for 35 year old woman who came voluntarily to the emergency room after cutting herself on the wrist Referring Physician:  Jimmye Norman Patient Identification: Christine Allison MRN:  [id number] Principal Diagnosis: Bipolar disorder [facility]) Diagnosis:   Patient Active Problem List   Diagnosis Date Noted  . Self-inflicted laceration of wrist [S61.519A] 06/28/2016  . Alcohol abuse [F10.10] 06/28/2016  . Bipolar disorder ([facility]) [F31.9] 06/28/2016    Total Time spent with patient: 1 hour  Subjective:   Christine Allison is a 35 y.o. female patient admitted with "I've just been under a lot of stress".  HPI:  Patient interviewed. Chart reviewed. Patient says that she's been under a lot of emotional stress recently. June is always an emotional month for her because of a lot of bad memories. She also has started working recently which is a good thing but is adding to her stress. She also admits that she had been drinking yesterday more than the normal amount probably had at least a sixpack or more. She got into some kind of conflict with her ex-boyfriend which got her upset. Her mother then came over to see her and apparently despite her mother's attempts to be helpful that just escalated things. Patient cut herself up and down her forearms. She doesn't remember doing it and admits that she had blacked out. Denies that she was abusing any other drugs. Meanwhile she says that she has been compliant with her prescribed medication from her outpatient providers. She denies any current suicidal intent or wish and says she never wanted to kill himself yesterday. Denies any psychotic symptoms. She says she is compliant with her outpatient treatment.  Medical history: Multiple very superficial scratches on her arms that don't require any further treatment at this point. No other active medical problems.  Social history: Patient lives with a boyfriend and  says she has a very good relationship with him. She had an ex-with whom she had some kind of verbal disagreement yesterday that was upsetting to her. She recently started working at [employer]. She likes it but it is stressful to be working full time. Somewhat labile relationship with her family of origin.  Substance abuse history: Admits that she is drinking more than she ought to although she says it hasn't necessarily been overall increasing. Estimates she drinks a couple times a week. The 6 pack that she had yesterday was more than usual. Denies other drug abuse.  Past Psychiatric History: Patient has a history of 2 prior psychiatric hospitalizations at [facility] but it's been a couple years ago. She does have a history of suicide attempts but has not had any suicidal thinking recently. She goes to [facility] and sees a Social worker and also a physician. Takes Cymbalta and Ritalin. No history of violence to others.  Risk to Self: Suicidal Ideation: No Suicidal Intent: No Is patient at risk for suicide?: No Suicidal Plan?: No Access to Means: No What has been your use of drugs/alcohol within the last 12 months?: alcohol How many times?: 1 Other Self Harm Risks: recent cut behavior superficial Triggers for Past Attempts: None known Intentional Self Injurious Behavior: Cutting (most recent but denies states was "acting out") Comment - Self Injurious Behavior:  (pt states was "acting out") Risk to Others: Homicidal Ideation: No Thoughts of Harm to Others: No Current Homicidal Intent: No Current Homicidal Plan: No Access to Homicidal Means: No Identified Victim: n/a History of harm to others?:  No Assessment of Violence: None Noted Violent Behavior Description: none noted Does patient have access to weapons?: No Criminal Charges Pending?: Yes Describe Pending Criminal Charges: pt. on probation for msidemeanor Does patient have a court date: Yes Court Date: 07/11/16 (pt did not specify date  ) Prior Inpatient Therapy: Prior Inpatient Therapy: Yes Prior Therapy Dates: 2013 Prior Therapy Facilty/Provider(s): [facility] Reason for Treatment: overdose Prior Outpatient Therapy: Prior Outpatient Therapy: Yes Prior Therapy Dates: 6-17 Prior Therapy Facilty/Provider(s): [facility] Reason for Treatment: depression/ anxiety Does patient have an ACCT team?: No Does patient have Intensive In-House Services?  : No Does patient have [facility] services? : No Does patient have P4CC services?: No  Past Medical History:  Past Medical History  Diagnosis Date  . Ovarian cyst   . Anxiety   . Depression     Past Surgical History  Procedure Laterality Date  . Appendectomy     Family History: History reviewed. No pertinent family history. Family Psychiatric  History: Patient says that her father had an alcohol problem. Social History:  History  Alcohol Use  . Yes    Comment: 2-3 beers dailey     History  Drug Use No    Social History   Social History  . Marital Status: Single    Spouse Name: N/A  . Number of Children: N/A  . Years of Education: N/A   Social History Main Topics  . Smoking status: Current Every Day Smoker -- 0.50 packs/day    Types: Cigarettes  . Smokeless tobacco: Never Used  . Alcohol Use: Yes     Comment: 2-3 beers dailey  . Drug Use: No  . Sexual Activity: Not Asked   Other Topics Concern  . None   Social History Narrative   Additional Social History:    Allergies:  No Known Allergies  Labs:  Results for orders placed or performed during the hospital encounter of 06/28/16 (from the past 48 hour(s))  Comprehensive metabolic panel     Status: Abnormal   Collection Time: 06/28/16  2:27 AM  Result Value Ref Range   Sodium 135 135 - 145 mmol/L   Potassium 3.5 3.5 - 5.1 mmol/L   Chloride 106 101 - 111 mmol/L   CO2 20 (L) 22 - 32 mmol/L   Glucose, Bld 103 (H) 65 - 99 mg/dL   BUN 12 6 - 20 mg/dL   Creatinine, Ser 0.74 0.44 - 1.00 mg/dL   Calcium 9.3 8.9 -  10.3 mg/dL   Total Protein 7.8 6.5 - 8.1 g/dL   Albumin 4.4 3.5 - 5.0 g/dL   AST 40 15 - 41 U/L   ALT 55 (H) 14 - 54 U/L   Alkaline Phosphatase 81 38 - 126 U/L   Total Bilirubin 0.6 0.3 - 1.2 mg/dL   GFR calc non Af Amer >60 >60 mL/min   GFR calc Af Amer >60 >60 mL/min    Comment: (NOTE) The eGFR has been calculated using the CKD EPI equation. This calculation has not been validated in all clinical situations. eGFR's persistently <60 mL/min signify possible Chronic Kidney Disease.    Anion gap 9 5 - 15  Ethanol     Status: Abnormal   Collection Time: 06/28/16  2:27 AM  Result Value Ref Range   Alcohol, Ethyl (B) 212 (H) <5 mg/dL    Comment:        LOWEST DETECTABLE LIMIT FOR SERUM ALCOHOL IS 5 mg/dL FOR MEDICAL PURPOSES ONLY   CBC with Diff  Status: Abnormal   Collection Time: 06/28/16  2:27 AM  Result Value Ref Range   WBC 11.4 (H) 3.6 - 11.0 K/uL   RBC 4.35 3.80 - 5.20 MIL/uL   Hemoglobin 15.0 12.0 - 16.0 g/dL    Comment: RESULT REPEATED AND VERIFIED   HCT 42.0 35.0 - 47.0 %   MCV 96.6 80.0 - 100.0 fL   MCH 34.6 (H) 26.0 - 34.0 pg   MCHC 35.8 32.0 - 36.0 g/dL   RDW 12.9 11.5 - 14.5 %   Platelets 252 150 - 440 K/uL   Neutrophils Relative % 58% %   Neutro Abs 6.7 (H) 1.4 - 6.5 K/uL   Lymphocytes Relative 29% %   Lymphs Abs 3.3 1.0 - 3.6 K/uL   Monocytes Relative 8% %   Monocytes Absolute 0.9 0.2 - 0.9 K/uL   Eosinophils Relative 4% %   Eosinophils Absolute 0.5 0 - 0.7 K/uL   Basophils Relative 1% %   Basophils Absolute 0.1 0 - 0.1 K/uL  Salicylate level     Status: None   Collection Time: 06/28/16  2:27 AM  Result Value Ref Range   Salicylate Lvl <1.1 2.8 - 30.0 mg/dL  Acetaminophen level     Status: Abnormal   Collection Time: 06/28/16  2:27 AM  Result Value Ref Range   Acetaminophen (Tylenol), Serum <10 (L) 10 - 30 ug/mL    Comment:        THERAPEUTIC CONCENTRATIONS VARY SIGNIFICANTLY. A RANGE OF 10-30 ug/mL MAY BE AN EFFECTIVE CONCENTRATION FOR MANY  PATIENTS. HOWEVER, SOME ARE BEST TREATED AT CONCENTRATIONS OUTSIDE THIS RANGE. ACETAMINOPHEN CONCENTRATIONS >150 ug/mL AT 4 HOURS AFTER INGESTION AND >50 ug/mL AT 12 HOURS AFTER INGESTION ARE OFTEN ASSOCIATED WITH TOXIC REACTIONS.   Urine Drug Screen, Qualitative ([facility] only)     Status: None   Collection Time: 06/28/16  2:27 AM  Result Value Ref Range   Tricyclic, Ur Screen NONE DETECTED NONE DETECTED   Amphetamines, Ur Screen NONE DETECTED NONE DETECTED   MDMA (Ecstasy)Ur Screen NONE DETECTED NONE DETECTED   Cocaine Metabolite,Ur [place] NONE DETECTED NONE DETECTED   Opiate, Ur Screen NONE DETECTED NONE DETECTED   Phencyclidine (PCP) Ur S NONE DETECTED NONE DETECTED   Cannabinoid 50 Ng, Ur [place] NONE DETECTED NONE DETECTED   Barbiturates, Ur Screen NONE DETECTED NONE DETECTED   Benzodiazepine, Ur Scrn NONE DETECTED NONE DETECTED   Methadone Scn, Ur NONE DETECTED NONE DETECTED    Comment: (NOTE) 735  Tricyclics, urine               Cutoff 1000 ng/mL 200  Amphetamines, urine             Cutoff 1000 ng/mL 300  MDMA (Ecstasy), urine           Cutoff 500 ng/mL 400  Cocaine Metabolite, urine       Cutoff 300 ng/mL 500  Opiate, urine                   Cutoff 300 ng/mL 600  Phencyclidine (PCP), urine      Cutoff 25 ng/mL 700  Cannabinoid, urine              Cutoff 50 ng/mL 800  Barbiturates, urine             Cutoff 200 ng/mL 900  Benzodiazepine, urine           Cutoff 200 ng/mL 1000 Methadone, urine  Cutoff 300 ng/mL 1100 1200 The urine drug screen provides only a preliminary, unconfirmed 1300 analytical test result and should not be used for non-medical 1400 purposes. Clinical consideration and professional judgment should 1500 be applied to any positive drug screen result due to possible 1600 interfering substances. A more specific alternate chemical method 1700 must be used in order to obtain a confirmed analytical result.  1800 Gas chromato graphy / mass spectrometry  (GC/MS) is the preferred 1900 confirmatory method.   Pregnancy, urine     Status: None   Collection Time: 06/28/16  2:27 AM  Result Value Ref Range   Preg Test, Ur NEGATIVE NEGATIVE  Pregnancy, urine POC     Status: None   Collection Time: 06/28/16  2:33 AM  Result Value Ref Range   Preg Test, Ur NEGATIVE NEGATIVE    Comment:        THE SENSITIVITY OF THIS METHODOLOGY IS >24 mIU/mL     Current Facility-Administered Medications  Medication Dose Route Frequency Provider Last Rate Last Dose  . DULoxetine (CYMBALTA) DR capsule 60 mg  60 mg Oral Daily Rudene Re, MD   60 mg at 06/28/16 1204  . methylphenidate (RITALIN) tablet 15 mg  15 mg Oral BID WC Rudene Re, MD   15 mg at 06/28/16 1204   Current Outpatient Prescriptions  Medication Sig Dispense Refill  . DULoxetine (CYMBALTA) 60 MG capsule Take 1 capsule (60 mg total) by mouth daily. 7 capsule 0  . methylphenidate (RITALIN) 5 MG tablet Take 15 mg by mouth 2 (two) times daily with breakfast and lunch.      Musculoskeletal: Strength & Muscle Tone: within normal limits Gait & Station: normal Patient leans: N/A  Psychiatric Specialty Exam: Physical Exam  Nursing note and vitals reviewed. Constitutional: She appears well-developed and well-nourished.  HENT:  Head: Normocephalic and atraumatic.  Eyes: Conjunctivae are normal. Pupils are equal, round, and reactive to light.  Neck: Normal range of motion.  Cardiovascular: Regular rhythm and normal heart sounds.   Respiratory: Effort normal. No respiratory distress.  GI: Soft.  Musculoskeletal: Normal range of motion.  Neurological: She is alert.  Skin: Skin is warm and dry.     Psychiatric: Her speech is normal and behavior is normal. Judgment and thought content normal. Her mood appears anxious. She exhibits abnormal recent memory.    Review of Systems  Constitutional: Negative.   HENT: Negative.   Eyes: Negative.   Respiratory: Negative.     Cardiovascular: Negative.   Gastrointestinal: Negative.   Musculoskeletal: Negative.   Skin: Negative.   Neurological: Negative.   Psychiatric/Behavioral: Positive for memory loss and substance abuse. Negative for depression, suicidal ideas and hallucinations. The patient is nervous/anxious. The patient does not have insomnia.     Blood pressure 104/63, pulse 84, temperature 97.8 F (36.6 C), temperature source Oral, resp. rate 18, height 5' 8"  (1.727 m), weight 74.844 kg (165 lb), last menstrual period 06/08/2016, SpO2 95 %.Body mass index is 25.09 kg/(m^2).  General Appearance: Disheveled  Eye Contact:  Good  Speech:  Clear and Coherent  Volume:  Normal  Mood:  Anxious  Affect:  Congruent  Thought Process:  Goal Directed  Orientation:  Full (Time, Place, and Person)  Thought Content:  Logical  Suicidal Thoughts:  No  Homicidal Thoughts:  No  Memory:  Immediate;   Good Recent;   Poor Remote;   Fair  Judgement:  Fair  Insight:  Fair  Psychomotor Activity:  Normal  Concentration:  Concentration: Fair  Recall:  [employer] of Knowledge:  Fair  Language:  Fair  Akathisia:  No  Handed:  Right  AIMS (if indicated):     Assets:  Communication Skills Desire for Improvement Financial Resources/Insurance Housing Physical Health Resilience Social Support  ADL's:  Intact  Cognition:  WNL  Sleep:        Treatment Plan Summary: Plan This is a 35 year old woman who cut herself on the wrists yesterday while intoxicated. She has been cooperative with treatment. There is no sign of psychosis. Patient has reasonably good insight and understanding of the issues. She knowledge is that she should be drinking less. She agrees to cut back on the drinking and will continue to follow-up with [facility] for her outpatient treatment. Says that she intends to get in touch with her counselor as soon as possible. Patient does not require inpatient hospital level treatment. Case reviewed with emergency  room doctor. Patient can be discharged to outpatient treatment  Disposition: Patient does not meet criteria for psychiatric inpatient admission. Supportive therapy provided about ongoing stressors.  Alethia Berthold, MD 06/28/2016 1:47 PM

## 2017-04-30 ENCOUNTER — Emergency Department
Admission: EM | Admit: 2017-04-30 | Discharge: 2017-04-30 | Disposition: A | Discharge status: Home / Self Care | Payer: Self-pay | Attending: Emergency Medicine | Admitting: Emergency Medicine

## 2017-04-30 ENCOUNTER — Emergency Department: Payer: Self-pay

## 2017-04-30 DIAGNOSIS — F17210 Nicotine dependence, cigarettes, uncomplicated: Secondary | ICD-10-CM | POA: Insufficient documentation

## 2017-04-30 DIAGNOSIS — R109 Unspecified abdominal pain: Secondary | ICD-10-CM | POA: Insufficient documentation

## 2017-04-30 DIAGNOSIS — R1031 Right lower quadrant pain: Secondary | ICD-10-CM | POA: Insufficient documentation

## 2017-04-30 DIAGNOSIS — Z79899 Other long term (current) drug therapy: Secondary | ICD-10-CM | POA: Insufficient documentation

## 2017-04-30 DIAGNOSIS — Z8742 Personal history of other diseases of the female genital tract: Secondary | ICD-10-CM | POA: Insufficient documentation

## 2017-04-30 LAB — URINALYSIS, COMPLETE (UACMP) WITH MICROSCOPIC
BACTERIA UA: NONE SEEN
BILIRUBIN URINE: NEGATIVE
Glucose, UA: NEGATIVE mg/dL
Ketones, ur: NEGATIVE mg/dL
NITRITE: NEGATIVE
PROTEIN: NEGATIVE mg/dL
SPECIFIC GRAVITY, URINE: 1.02 (ref 1.005–1.030)
pH: 5 (ref 5.0–8.0)

## 2017-04-30 LAB — CBC
HEMATOCRIT: 41.9 % (ref 35.0–47.0)
HEMOGLOBIN: 14.8 g/dL (ref 12.0–16.0)
MCH: 34.5 pg — ABNORMAL HIGH (ref 26.0–34.0)
MCHC: 35.4 g/dL (ref 32.0–36.0)
MCV: 97.4 fL (ref 80.0–100.0)
Platelets: 229 10*3/uL (ref 150–440)
RBC: 4.3 MIL/uL (ref 3.80–5.20)
RDW: 12.6 % (ref 11.5–14.5)
WBC: 8.2 10*3/uL (ref 3.6–11.0)

## 2017-04-30 LAB — COMPREHENSIVE METABOLIC PANEL
ALBUMIN: 4.1 g/dL (ref 3.5–5.0)
ALT: 69 U/L — ABNORMAL HIGH (ref 14–54)
ANION GAP: 6 (ref 5–15)
AST: 45 U/L — ABNORMAL HIGH (ref 15–41)
Alkaline Phosphatase: 75 U/L (ref 38–126)
BILIRUBIN TOTAL: 0.7 mg/dL (ref 0.3–1.2)
BUN: 10 mg/dL (ref 6–20)
CO2: 23 mmol/L (ref 22–32)
Calcium: 9.3 mg/dL (ref 8.9–10.3)
Chloride: 105 mmol/L (ref 101–111)
Creatinine, Ser: 0.78 mg/dL (ref 0.44–1.00)
GFR calc Af Amer: >60 mL/min (ref >60)
Glucose, Bld: 93 mg/dL (ref 65–99)
POTASSIUM: 3.6 mmol/L (ref 3.5–5.1)
Sodium: 134 mmol/L — ABNORMAL LOW (ref 135–145)
TOTAL PROTEIN: 7.2 g/dL (ref 6.5–8.1)

## 2017-04-30 LAB — HCG, QUANTITATIVE, PREGNANCY: hCG, Beta Chain, Quant, S: <1 m[IU]/mL (ref <5)

## 2017-04-30 LAB — LIPASE, BLOOD: Lipase: 22 U/L (ref 11–51)

## 2017-04-30 LAB — PREGNANCY, URINE: PREG TEST UR: NEGATIVE

## 2017-04-30 MED ORDER — IBUPROFEN 400 MG PO TABS
600.0000 mg | ORAL_TABLET | Freq: Once | ORAL | Status: AC
  Administered 2017-04-30: 600 mg via ORAL
  Filled 2017-04-30: qty 2

## 2017-04-30 NOTE — ED Notes (Signed)
Pt taken to Ultrasound.

## 2017-04-30 NOTE — ED Triage Notes (Signed)
Pt reports right side flank radiating to groin for three weeks. Denies dysuria. Pt reports intermittent nausea. Pt reports always has loose stools.

## 2017-04-30 NOTE — ED Provider Notes (Signed)
[facility] Emergency Department Provider Note  ____________________________________________   First MD Initiated Contact with Patient 04/30/17 1921     (approximate)  I have reviewed the triage vital signs and the nursing notes.   HISTORY  Chief Complaint Flank Pain and Abdominal Pain    HPI Christine Allison is a 36 y.o. female who self presents to the emergency department with 1 day of worsening right flank pain radiating to her right groin. She has had intermittent pain for the past 3 weeks.Nothing in particular makes it better or worse. She denies fevers or chills. She denies dysuria frequency or hesitancy. Her last menstrual period was 2 weeks ago. She has a remote history of appendectomy and complex ovarian cyst.   Past Medical History:  Diagnosis Date  . Anxiety   . Depression   . Ovarian cyst     Patient Active Problem List   Diagnosis Date Noted  . Self-inflicted laceration of wrist 06/28/2016  . Alcohol abuse 06/28/2016  . Bipolar disorder ([facility]) 06/28/2016    Past Surgical History:  Procedure Laterality Date  . APPENDECTOMY      Prior to Admission medications   Medication Sig Start Date End Date Taking? Authorizing Provider  DULoxetine (CYMBALTA) 60 MG capsule Take 1 capsule (60 mg total) by mouth daily. 05/08/16 05/08/17  Menshew, Charlesetta Ivory, PA-C  methylphenidate (RITALIN) 5 MG tablet Take 15 mg by mouth 2 (two) times daily with breakfast and lunch.    [provider]    Allergies Patient has no known allergies.  No family history on file.  Social History Social History  Substance Use Topics  . Smoking status: Current Every Day Smoker    Packs/day: 0.50    Types: Cigarettes  . Smokeless tobacco: Never Used  . Alcohol use Yes     Comment: 2-3 beers dailey    Review of Systems Constitutional: No fever/chills Eyes: No visual changes. ENT: No sore throat. Cardiovascular: Denies chest pain. Respiratory:  Denies shortness of breath. Gastrointestinal: Positive abdominal pain.  Positive nausea, no vomiting.  No diarrhea.  No constipation. Genitourinary: Negative for dysuria. Musculoskeletal: Negative for back pain. Skin: Negative for rash. Neurological: Negative for headaches, focal weakness or numbness.   ____________________________________________   PHYSICAL EXAM:  VITAL SIGNS: ED Triage Vitals  Enc Vitals Group     BP 04/30/17 1742 119/74     Pulse Rate 04/30/17 1742 75     Resp 04/30/17 1742 15     Temp 04/30/17 1742 98.5 F (36.9 C)     Temp Source 04/30/17 1742 Oral     SpO2 04/30/17 1742 97 %     Weight 04/30/17 1743 160 lb (72.6 kg)     Height 04/30/17 1743 5\' 8"  (1.727 m)     Head Circumference --      Peak Flow --      Pain Score 04/30/17 1755 8     Pain Loc --      Pain Edu? --      Excl. in GC? --     Constitutional: Alert and oriented x 4 well appearing nontoxic no diaphoresis speaks in full, clear sentences Eyes: PERRL EOMI. Head: Atraumatic. Nose: No congestion/rhinnorhea. Mouth/Throat: No trismus Neck: No stridor.   Cardiovascular: Normal rate, regular rhythm. Grossly normal heart sounds.  Good peripheral circulation. Respiratory: Normal respiratory effort.  No retractions. Lungs CTAB and moving good air Gastrointestinal: Soft nondistended mild tenderness right lower without tenderness over McBurney's point  no rebound or guarding no peritonitis negative Rovsing's negative Murphy's Musculoskeletal: No lower extremity edema   Neurologic:  Normal speech and language. No gross focal neurologic deficits are appreciated. Skin:  Skin is warm, dry and intact. No rash noted. Psychiatric: Mood and affect are normal. Speech and behavior are normal.    ____________________________________________   DIFFERENTIAL  Urinary colic, cholecystitis, appendicitis, ovarian cyst ____________________________________________   LABS (all labs ordered are listed, but only  abnormal results are displayed)  Labs Reviewed  COMPREHENSIVE METABOLIC PANEL - Abnormal; Notable for the following:       Result Value   Sodium 134 (*)    AST 45 (*)    ALT 69 (*)    All other components within normal limits  CBC - Abnormal; Notable for the following:    MCH 34.5 (*)    All other components within normal limits  URINALYSIS, COMPLETE (UACMP) WITH MICROSCOPIC - Abnormal; Notable for the following:    Color, Urine YELLOW (*)    APPearance HAZY (*)    Hgb urine dipstick SMALL (*)    Leukocytes, UA TRACE (*)    Squamous Epithelial / LPF 6-30 (*)    All other components within normal limits  LIPASE, BLOOD  PREGNANCY, URINE  HCG, QUANTITATIVE, PREGNANCY    No signs of infection __________________________________________  EKG   ____________________________________________  RADIOLOGY  Normal pelvic ultrasound ____________________________________________   PROCEDURES  Procedure(s) performed: no  Procedures  Critical Care performed: no  Observation: no ____________________________________________   INITIAL IMPRESSION / ASSESSMENT AND PLAN / ED COURSE  Pertinent labs & imaging results that were available during my care of the patient were reviewed by me and considered in my medical decision making (see chart for details).  The patient arrives well-appearing with a relatively benign abdominal exam in 3 weeks of symptoms. We discussed the possibility of a CT scan and she was concerned about the radiation which I think is reasonable. She is opted for a pelvic ultrasound and then we will discuss.  The patient's pelvic ultrasound was negative. We then again re-discussed a CT scan but she opted for abdominal recheck in outpatient management which I think is entirely reasonable. She is able to eat and drink. Her abdomen remains benign. She is discharged home in good condition.      ____________________________________________   FINAL CLINICAL  IMPRESSION(S) / ED DIAGNOSES  Final diagnoses:  Right flank pain      NEW MEDICATIONS STARTED DURING THIS VISIT:  Discharge Medication List as of 04/30/2017  9:35 PM       Note:  This document was prepared using Dragon voice recognition software and may include unintentional dictation errors.     Merrily Brittleifenbark, Willy Vorce, MD 05/01/17 1128

## 2017-04-30 NOTE — Discharge Instructions (Signed)
Please follow-up with your primary care physician on Monday for recheck. Return to the emergency department sooner for any new or worsening symptoms such as fevers, chills, worsening pain, if you cannot eat or drink, or for any other concerns whatsoever.  It was a pleasure to take care of you today, and thank you for coming to our emergency department.  If you have any questions or concerns before leaving please ask the nurse to grab me and I'm more than happy to go through your aftercare instructions again.  If you were prescribed any opioid pain medication today such as Norco, Vicodin, Percocet, morphine, hydrocodone, or oxycodone please make sure you do not drive when you are taking this medication as it can alter your ability to drive safely.  If you have any concerns once you are home that you are not improving or are in fact getting worse before you can make it to your follow-up appointment, please do not hesitate to call 911 and come back for further evaluation.  Merrily Brittle MD  Results for orders placed or performed during the hospital encounter of 04/30/17  Lipase, blood  Result Value Ref Range   Lipase 22 11 - 51 U/L  Comprehensive metabolic panel  Result Value Ref Range   Sodium 134 (L) 135 - 145 mmol/L   Potassium 3.6 3.5 - 5.1 mmol/L   Chloride 105 101 - 111 mmol/L   CO2 23 22 - 32 mmol/L   Glucose, Bld 93 65 - 99 mg/dL   BUN 10 6 - 20 mg/dL   Creatinine, Ser 1.61 0.44 - 1.00 mg/dL   Calcium 9.3 8.9 - 09.6 mg/dL   Total Protein 7.2 6.5 - 8.1 g/dL   Albumin 4.1 3.5 - 5.0 g/dL   AST 45 (H) 15 - 41 U/L   ALT 69 (H) 14 - 54 U/L   Alkaline Phosphatase 75 38 - 126 U/L   Total Bilirubin 0.7 0.3 - 1.2 mg/dL   GFR calc non Af Amer >60 >60 mL/min   GFR calc Af Amer >60 >60 mL/min   Anion gap 6 5 - 15  CBC  Result Value Ref Range   WBC 8.2 3.6 - 11.0 K/uL   RBC 4.30 3.80 - 5.20 MIL/uL   Hemoglobin 14.8 12.0 - 16.0 g/dL   HCT 04.5 40.9 - 81.1 %   MCV 97.4 80.0 - 100.0 fL   MCH 34.5 (H) 26.0 - 34.0 pg   MCHC 35.4 32.0 - 36.0 g/dL   RDW 91.4 78.2 - 95.6 %   Platelets 229 150 - 440 K/uL  Urinalysis, Complete w Microscopic  Result Value Ref Range   Color, Urine YELLOW (A) YELLOW   APPearance HAZY (A) CLEAR   Specific Gravity, Urine 1.020 1.005 - 1.030   pH 5.0 5.0 - 8.0   Glucose, UA NEGATIVE NEGATIVE mg/dL   Hgb urine dipstick SMALL (A) NEGATIVE   Bilirubin Urine NEGATIVE NEGATIVE   Ketones, ur NEGATIVE NEGATIVE mg/dL   Protein, ur NEGATIVE NEGATIVE mg/dL   Nitrite NEGATIVE NEGATIVE   Leukocytes, UA TRACE (A) NEGATIVE   RBC / HPF 0-5 0 - 5 RBC/hpf   WBC, UA 0-5 0 - 5 WBC/hpf   Bacteria, UA NONE SEEN NONE SEEN   Squamous Epithelial / LPF 6-30 (A) NONE SEEN   Mucous PRESENT   Pregnancy, urine  Result Value Ref Range   Preg Test, Ur NEGATIVE NEGATIVE  hCG, quantitative, pregnancy  Result Value Ref Range   hCG, Beta Chain,  Quant, S <1 <5 mIU/mL   [place] Transvaginal Non-ob  Result Date: 04/30/2017 CLINICAL DATA:  Right lower quadrant pain for 3 weeks. Pain extends from the right flank to the pelvis. History of appendectomy. History of ovarian cysts. EXAM: TRANSABDOMINAL AND TRANSVAGINAL ULTRASOUND OF PELVIS DOPPLER ULTRASOUND OF OVARIES TECHNIQUE: Both transabdominal and transvaginal ultrasound examinations of the pelvis were performed. Transabdominal technique was performed for global imaging of the pelvis including uterus, ovaries, adnexal regions, and pelvic cul-de-sac. It was necessary to proceed with endovaginal exam following the transabdominal exam to visualize the ovaries to better advantage. Color and duplex Doppler ultrasound was utilized to evaluate blood flow to the ovaries. COMPARISON:  None. FINDINGS: Uterus Measurements: 8.1 x 3.4 x 5.2 cm. No fibroids or other mass visualized. Endometrium Thickness: 10 mm.  No focal abnormality visualized. Right ovary Measurements: 3.3 x 1.7 x 1.5 cm. Normal appearance/no adnexal mass. Left ovary Measurements: 3.3 x  2.4 x 2.7 cm. 15 x 16 x 15 mm apparent complex cyst or corpus luteum. Ovary otherwise unremarkable. No adnexal masses. Pulsed Doppler evaluation of both ovaries demonstrates normal low-resistance arterial and venous waveforms. Other findings No abnormal free fluid. IMPRESSION: 1. Normal exam. No findings to account for the patient's pain. No ovarian torsion. Electronically Signed   By: Amie Portlandavid  Ormond M.D.   On: 04/30/2017 21:21   [place] Pelvis Complete  Result Date: 04/30/2017 CLINICAL DATA:  Right lower quadrant pain for 3 weeks. Pain extends from the right flank to the pelvis. History of appendectomy. History of ovarian cysts. EXAM: TRANSABDOMINAL AND TRANSVAGINAL ULTRASOUND OF PELVIS DOPPLER ULTRASOUND OF OVARIES TECHNIQUE: Both transabdominal and transvaginal ultrasound examinations of the pelvis were performed. Transabdominal technique was performed for global imaging of the pelvis including uterus, ovaries, adnexal regions, and pelvic cul-de-sac. It was necessary to proceed with endovaginal exam following the transabdominal exam to visualize the ovaries to better advantage. Color and duplex Doppler ultrasound was utilized to evaluate blood flow to the ovaries. COMPARISON:  None. FINDINGS: Uterus Measurements: 8.1 x 3.4 x 5.2 cm. No fibroids or other mass visualized. Endometrium Thickness: 10 mm.  No focal abnormality visualized. Right ovary Measurements: 3.3 x 1.7 x 1.5 cm. Normal appearance/no adnexal mass. Left ovary Measurements: 3.3 x 2.4 x 2.7 cm. 15 x 16 x 15 mm apparent complex cyst or corpus luteum. Ovary otherwise unremarkable. No adnexal masses. Pulsed Doppler evaluation of both ovaries demonstrates normal low-resistance arterial and venous waveforms. Other findings No abnormal free fluid. IMPRESSION: 1. Normal exam. No findings to account for the patient's pain. No ovarian torsion. Electronically Signed   By: Amie Portlandavid  Ormond M.D.   On: 04/30/2017 21:21   [place] Art/ven Flow Abd Pelv Doppler  Result  Date: 04/30/2017 CLINICAL DATA:  Right lower quadrant pain for 3 weeks. Pain extends from the right flank to the pelvis. History of appendectomy. History of ovarian cysts. EXAM: TRANSABDOMINAL AND TRANSVAGINAL ULTRASOUND OF PELVIS DOPPLER ULTRASOUND OF OVARIES TECHNIQUE: Both transabdominal and transvaginal ultrasound examinations of the pelvis were performed. Transabdominal technique was performed for global imaging of the pelvis including uterus, ovaries, adnexal regions, and pelvic cul-de-sac. It was necessary to proceed with endovaginal exam following the transabdominal exam to visualize the ovaries to better advantage. Color and duplex Doppler ultrasound was utilized to evaluate blood flow to the ovaries. COMPARISON:  None. FINDINGS: Uterus Measurements: 8.1 x 3.4 x 5.2 cm. No fibroids or other mass visualized. Endometrium Thickness: 10 mm.  No focal abnormality visualized. Right ovary Measurements: 3.3 x 1.7  x 1.5 cm. Normal appearance/no adnexal mass. Left ovary Measurements: 3.3 x 2.4 x 2.7 cm. 15 x 16 x 15 mm apparent complex cyst or corpus luteum. Ovary otherwise unremarkable. No adnexal masses. Pulsed Doppler evaluation of both ovaries demonstrates normal low-resistance arterial and venous waveforms. Other findings No abnormal free fluid. IMPRESSION: 1. Normal exam. No findings to account for the patient's pain. No ovarian torsion. Electronically Signed   By: Amie Portland M.D.   On: 04/30/2017 21:21

## 2018-06-14 ENCOUNTER — Ambulatory Visit: Payer: Self-pay | Attending: Oncology | Admitting: *Deleted

## 2018-06-14 ENCOUNTER — Encounter (INDEPENDENT_AMBULATORY_CARE_PROVIDER_SITE_OTHER): Payer: Self-pay

## 2018-06-14 VITALS — Ht 68.0 in | Wt 159.0 lb

## 2018-06-14 DIAGNOSIS — R87618 Other abnormal cytological findings on specimens from cervix uteri: Secondary | ICD-10-CM

## 2018-06-14 DIAGNOSIS — R8789 Other abnormal findings in specimens from female genital organs: Secondary | ICD-10-CM

## 2018-06-14 NOTE — Progress Notes (Signed)
  Subjective:     Patient ID: Merlyn AlbertKelly E Maciolek, female   DOB: Feb 24, 1981, 37 y.o.   MRN: [id number]  Negative / HPV positive pap smear 12/03/17  Review of Systems     Objective:   Physical Exam     Assessment:     37 year old White female referred to [facility] by Toy CookeyEmily Headrick, FNP [facility] for colposcopy and possible biopsy.  Per office notes and pap report the patient had a negative / HPV positive pap smear 12/03/17.  Per [facility]P guidelines patient would need next pap 1 year after her last pap.  Discussed guidelines with patient and asked if she had had previous abnormal pap smears. States she has a history of abnormal paps and colposcopies.  Called and spoke to Toy CookeyEmily Headrick, FNP to further discuss patient's history and assess need for colposcopy, since it is not indicated based on the most recent pap.  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  Those results are not visible for review and per Irving BurtonEmily only.  Hand-out given on "Understanding Pap Smears".  Patient has been screened for eligibility.  She does not have any insurance, Medicare or Medicaid.  She also meets financial eligibility.  Hand-out given on the Affordable Care Act.    Plan:     Patient has been scheduled to see Dr. Valentino Saxonherry on July 14, 2018 for colposcopy and possible biopsy.  Will follow-up per [facility] protocol.

## 2018-06-29 DIAGNOSIS — N944 Primary dysmenorrhea: Secondary | ICD-10-CM | POA: Insufficient documentation

## 2018-07-14 ENCOUNTER — Encounter: Payer: Self-pay | Admitting: Obstetrics and Gynecology

## 2018-07-30 ENCOUNTER — Ambulatory Visit (INDEPENDENT_AMBULATORY_CARE_PROVIDER_SITE_OTHER): Payer: Medicaid Other | Admitting: Obstetrics and Gynecology

## 2018-07-30 ENCOUNTER — Encounter: Payer: Self-pay | Admitting: Obstetrics and Gynecology

## 2018-07-30 ENCOUNTER — Other Ambulatory Visit: Payer: Self-pay | Admitting: Obstetrics and Gynecology

## 2018-07-30 VITALS — BP 122/79 | HR 74 | Ht 68.0 in | Wt 161.1 lb

## 2018-07-30 DIAGNOSIS — R8789 Other abnormal findings in specimens from female genital organs: Secondary | ICD-10-CM

## 2018-07-30 DIAGNOSIS — R87618 Other abnormal cytological findings on specimens from cervix uteri: Secondary | ICD-10-CM

## 2018-07-30 NOTE — Progress Notes (Signed)
     GYNECOLOGY CLINIC COLPOSCOPY PROCEDURE NOTE  37 y.o. G0P0000 [facility] for NILM, + HR HPV pap smear on 11/2017. Discussed role for HPV in cervical dysplasia, need for surveillance. Patient does have a history of abnormal pap smears in the past, requiring colposcopy.   Patient given informed consent, signed copy in the chart, time out was performed.  Placed in lithotomy position. Cervix viewed with speculum and colposcope after application of acetic acid.   Colposcopy adequate? Yes  HPV changes noted at 5-6 o'clock; corresponding biopsies obtained.  ECC specimen obtained. All specimens were labeled and sent to pathology.   Patient was given post procedure instructions.  Will follow up pathology and manage accordingly; patient will be contacted with results and recommendations.  Routine preventative health maintenance measures emphasized.    Hildred Laserherry, Raymundo Rout, MD [facility]

## 2018-07-30 NOTE — Patient Instructions (Signed)
Colposcopy, Care After  This sheet gives you information about how to care for yourself after your procedure. Your doctor may also give you more specific instructions. If you have problems or questions, contact your doctor.  What can I expect after the procedure?  If you did not have a tissue sample removed (did not have a biopsy), you may only have some spotting for a few days. You can go back to your normal activities.  If you had a tissue sample removed, it is common to have:  · Soreness and pain. This may last for a few days.  · Light-headedness.  · Mild bleeding from your vagina or dark-colored, grainy discharge from your vagina. This may last for a few days. You may need to wear a sanitary pad.  · Spotting for at least 48 hours after the procedure.    Follow these instructions at home:  · Take over-the-counter and prescription medicines only as told by your doctor. Ask your doctor what medicines you can start taking again. This is very important if you take blood-thinning medicine.  · Do not drive or use heavy machinery while taking prescription pain medicine.  · For 3 days, or as long as your doctor tells you, avoid:  ? Douching.  ? Using tampons.  ? Having sex.  · If you use birth control (contraception), keep using it.  · Limit activity for the first day after the procedure. Ask your doctor what activities are safe for you.  · It is up to you to get the results of your procedure. Ask your doctor when your results will be ready.  · Keep all follow-up visits as told by your doctor. This is important.  Contact a doctor if:  · You get a skin rash.  Get help right away if:  · You are bleeding a lot from your vagina. It is a lot of bleeding if you are using more than one pad an hour for 2 hours in a row.  · You have clumps of blood (blood clots) coming from your vagina.  · You have a fever.  · You have chills  · You have pain in your lower belly (pelvic area).  · You have signs of infection, such as vaginal  discharge that is:  ? Different than usual.  ? Yellow.  ? Bad-smelling.  · You have very pain or cramps in your lower belly that do not get better with medicine.  · You feel light-headed.  · You feel dizzy.  · You pass out (faint).  Summary  · If you did not have a tissue sample removed (did not have a biopsy), you may only have some spotting for a few days. You can go back to your normal activities.  · If you had a tissue sample removed, it is common to have mild pain and spotting for 48 hours.  · For 3 days, or as long as your doctor tells you, avoid douching, using tampons and having sex.  · Get help right away if you have bleeding, very bad pain, or signs of infection.  This information is not intended to replace advice given to you by your health care provider. Make sure you discuss any questions you have with your health care provider.  Document Released: 05/19/2008 Document Revised: 08/20/2016 Document Reviewed: 08/20/2016  Elsevier Interactive Patient Education © 2018 Elsevier Inc.

## 2018-07-30 NOTE — Progress Notes (Signed)
Pt is present today for a colpo. No complaints.

## 2018-08-04 LAB — PATHOLOGY

## 2018-08-06 ENCOUNTER — Telehealth: Payer: Self-pay | Admitting: Obstetrics and Gynecology

## 2018-08-06 NOTE — Telephone Encounter (Signed)
The patient called and stated that she would like to speak with her nursein regards to some results the patient is expecting to hear back from. No other information was disclosed. Please advise.

## 2018-08-06 NOTE — Telephone Encounter (Signed)
Pt called and informed that she needed to come to the office for her test results. Pt made an appointment today.

## 2018-08-11 ENCOUNTER — Encounter: Payer: Self-pay | Admitting: Obstetrics and Gynecology

## 2018-08-11 ENCOUNTER — Ambulatory Visit (INDEPENDENT_AMBULATORY_CARE_PROVIDER_SITE_OTHER): Payer: PRIVATE HEALTH INSURANCE | Admitting: Obstetrics and Gynecology

## 2018-08-11 VITALS — BP 109/68 | HR 72 | Ht 67.0 in | Wt 161.5 lb

## 2018-08-11 DIAGNOSIS — N871 Moderate cervical dysplasia: Secondary | ICD-10-CM

## 2018-08-11 MED ORDER — DIAZEPAM 5 MG PO TABS: 5.0000 mg | ORAL_TABLET | Freq: Once | ORAL | 0 refills | Status: AC

## 2018-08-11 NOTE — Progress Notes (Signed)
Pt is present today for test results.  

## 2018-08-11 NOTE — Progress Notes (Addendum)
    GYNECOLOGY PROGRESS NOTE  Subjective:    Patient ID: Christine Allison, female    DOB: August 14, 1981, 37 y.o.   MRN: [id number]  HPI  Patient is a 37 y.o. G0P0000 female who presents for discussion of colposcopy results. 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  Denies complaints today.   The following portions of the patient's history were reviewed and updated as appropriate: allergies, current medications, past family history, past medical history, past social history, past surgical history and problem list.  Review of Systems Pertinent items noted in HPI and remainder of comprehensive ROS otherwise negative.   Objective:   Blood pressure 109/68, pulse 72, height 5\' 7"  (1.702 m), weight 161 lb 8 oz (73.3 kg), last menstrual period 08/09/2018. General appearance: alert and no distress Exam deferred.    Assessment:   CIN II  Plan:   Discussion had with patient regarding biopsy results.  Advised that the next step in management would be to have a LEEP procedure to treat the abnormal cells and assess for further pathology.  Discussed risks of procedure, aftercare, and options of having in office or in OR (preferrably the office as [facility]).  Patient notes understanding. Advised to take Ibuprofen 800 mg 30 min prior to procedure, and given prescription for Valium prior to procedure if needed for anxiety.  Patient to be scheduled for 08/23/2018 in office. Handout on procedure given.    A total of 15 minutes were spent face-to-face with the patient during this encounter and over half of that time dealt with counseling and coordination of care.  Hildred Laserherry, Corean Yoshimura, MD [facility]

## 2018-08-13 ENCOUNTER — Encounter: Payer: Self-pay | Admitting: *Deleted

## 2018-08-24 ENCOUNTER — Other Ambulatory Visit (HOSPITAL_COMMUNITY)
Admission: RE | Admit: 2018-08-24 | Discharge: 2018-08-24 | Disposition: A | Discharge status: Home / Self Care | Payer: Medicaid Other | Source: Ambulatory Visit | Attending: Obstetrics and Gynecology | Admitting: Obstetrics and Gynecology

## 2018-08-24 ENCOUNTER — Encounter: Payer: Self-pay | Admitting: Obstetrics and Gynecology

## 2018-08-24 ENCOUNTER — Ambulatory Visit (INDEPENDENT_AMBULATORY_CARE_PROVIDER_SITE_OTHER): Payer: Medicaid Other | Admitting: Obstetrics and Gynecology

## 2018-08-24 VITALS — BP 112/71 | HR 86 | Ht 68.0 in | Wt 161.5 lb

## 2018-08-24 DIAGNOSIS — N871 Moderate cervical dysplasia: Secondary | ICD-10-CM

## 2018-08-24 NOTE — Progress Notes (Signed)
   GYNECOLOGY OFFICE PROCEDURE NOTE  Christine Allison is a 37 y.o. G0P0 here for LEEP. No GYN concerns. Pap smear and colposcopy history reviewed.    Pap NILM, but HR HPV+ Colpo Biopsy: 5 o'clock biopsy with squamous metaplasia.   ECC: Endocervix with HGSIL (CIN II).    Risks, benefits, alternatives, and limitations of procedure explained to patient, including pain, bleeding, infection, failure to remove abnormal tissue and failure to cure dysplasia, need for repeat procedures, damage to pelvic organs, cervical incompetence.  Role of HPV,cervical dysplasia and need for close followup was empasized. Informed written consent was obtained. All questions were answered. Time out performed. Urine pregnancy test was negative.  Procedure: The patient was placed in lithotomy position and the bivalved coated speculum was placed in the patient's vagina. A grounding pad placed on the patient. Lugol's solution was applied to the cervix and areas of decreased uptake were noted around the transformation zone.   Local anesthesia was administered via an intracervical block using 10 ml of 2% Lidocaine with epinephrine. The suction was turned on and the Medium 1X Fisher Cone Biopsy Excisor on 40 Watts of blended current was used to excise the area of decreased uptake and excise the entire transformation zone. An ECC was performed. Excellent hemostasis was achieved using roller ball coagulation set at 40 Watts coagulation current. Monsel's solution was then applied and the speculum was removed from the vagina. Specimens were sent to pathology.  The patient tolerated the procedure well. Post-operative instructions given to patient, including instruction to seek medical attention for persistent bright red bleeding, fever, abdominal/pelvic pain, dysuria, nausea or vomiting. She was also told about the possibility of having copious yellow to black tinged discharge for weeks. She was counseled to avoid anything in the vagina  (sex/douching/tampons) for 3 weeks. She has a 3 week post-operative check to assess wound healing, review results and discuss further management.    Hildred Laser, MD [facility]

## 2018-08-24 NOTE — Progress Notes (Signed)
PT is present today for a LEEP. Pt stated that she is very nervous.

## 2018-08-24 NOTE — Patient Instructions (Signed)
Loop Electrosurgical Excision Procedure, Care After  Refer to this sheet in the next few weeks. These instructions provide you with information about caring for yourself after your procedure. Your health care provider may also give you more specific instructions. Your treatment has been planned according to current medical practices, but problems sometimes occur. Call your health care provider if you have any problems or questions after your procedure.  What can I expect after the procedure?  After the procedure, it is common to have:  · Abdominal cramps that are similar to menstrual cramps. These may last for up to 1 week.  · Pink-tinged or bloody vaginal discharge, including light to moderate bleeding, for 1-2 weeks.  · A dark-colored vaginal discharge. This is from the paste that was applied to your cervix to control bleeding.    Follow these instructions at home:  Activity  · Return to your normal activities as told by your health care provider. Ask your health care provider what activities are safe for you.  · Avoid strenuous physical activity for as long as told by your health care provider.  · Do not lift anything that is heavier than 10 lb (4.5 kg) until your health care provider says that it is safe.  Bathing  · Do not take baths, swim, or use a hot tub until your health care provider approves.  · You may take showers.  Lifestyle  · Do not put anything in your vagina for 2 weeks after the procedure or until your health care provider says that it is okay. This includes tampons, creams, and douches.  · Do not have sexual intercourse until your health care provider approves.  General instructions  · Take over-the-counter and prescription medicines only as told by your health care provider.  · Keep all follow-up visits as told by your health care provider. This is important.  Contact a health care provider if:  · You have a fever or chills.  · You feel unusually weak.  · You have vaginal bleeding that is  heavier or longer than a normal menstrual cycle. A sign of this can be soaking a pad with blood.  · You have severe pain.  · You have nausea or vomiting.  · You develop a bad smelling vaginal discharge.  This information is not intended to replace advice given to you by your health care provider. Make sure you discuss any questions you have with your health care provider.  Document Released: 08/14/2011 Document Revised: 12/28/2015 Document Reviewed: 10/15/2015  Elsevier Interactive Patient Education © 2018 Elsevier Inc.

## 2018-08-31 ENCOUNTER — Telehealth: Payer: Self-pay | Admitting: Obstetrics and Gynecology

## 2018-08-31 NOTE — Telephone Encounter (Signed)
The patient had a missed call and is calling back; will await the call back from the nurse, please advise, thanks.

## 2018-08-31 NOTE — Telephone Encounter (Signed)
Pt is aware of her test results.  

## 2018-08-31 NOTE — Telephone Encounter (Signed)
Pt was called back and is aware of test results.

## 2018-08-31 NOTE — Telephone Encounter (Signed)
Patient called stating she has been waiting on a return call. She is anxious and requested a call back before you go to lunch. Thanks

## 2018-09-09 ENCOUNTER — Encounter: Payer: Self-pay | Admitting: *Deleted

## 2018-09-09 NOTE — Progress Notes (Signed)
Patient signed Medicaid application.  She will need a LEEP procedure.

## 2018-09-15 ENCOUNTER — Encounter: Payer: Self-pay | Admitting: Obstetrics and Gynecology

## 2018-09-15 ENCOUNTER — Ambulatory Visit (INDEPENDENT_AMBULATORY_CARE_PROVIDER_SITE_OTHER): Payer: Medicaid Other | Admitting: Obstetrics and Gynecology

## 2018-09-15 ENCOUNTER — Encounter: Payer: Self-pay | Admitting: *Deleted

## 2018-09-15 VITALS — BP 116/73 | HR 76 | Ht 68.0 in | Wt 163.9 lb

## 2018-09-15 DIAGNOSIS — Z9889 Other specified postprocedural states: Secondary | ICD-10-CM

## 2018-09-15 DIAGNOSIS — N871 Moderate cervical dysplasia: Secondary | ICD-10-CM

## 2018-09-15 NOTE — Progress Notes (Signed)
    OBSTETRICS/GYNECOLOGY POST-OPERATIVE CLINIC VISIT  Subjective:     Christine Allison is a 37 y.o. female who presents to the clinic 3 weeks status post LEEP for cervical dysplasia (CIN . The patient is not having any pain. Overall doing well.   The following portions of the patient's history were reviewed and updated as appropriate: allergies, current medications, past family history, past medical history, past social history, past surgical history and problem list.  Review of Systems A comprehensive review of systems was negative.    Objective:    BP 116/73   Pulse 76   Ht 5\' 8"  (1.727 m)   Wt 163 lb 14.4 oz (74.3 kg)   LMP 09/01/2018   BMI 24.92 kg/m  General:  alert and no distress  Abdomen: soft, bowel sounds active, non-tender  Pelvis:   external genitalia normal, rectovaginal septum normal.  Vagina with small amount of yellowish thin discharge.  Cervix normal appearing, healing well, granulation tissue present. Bimanual exam not performed.     Pathology:  1. Cervix, cone, 12 o'clock - HIGH GRADE SQUAMOUS INTRAEPITHELIAL LESION (HSIL, CIN-II) INVOLVING 9 O'CLOCK TO 12 O'CLOCK AND 12 O'CLOCK TO 3 O'CLOCK PORTIONS. - LOW GRADE SQUAMOUS INTRAEPITHELIAL LESION (LSIL, CIN-I) INVOLVING 3 O'CLOCK TO 6 O'CLOCK AND 6 O'CLOCK TO 9 O'CLOCK PORTIONS. - THE ECTOCERVICAL MARGIN IS POSITIVE FOR LOW GRADE SQUAMOUS INTRAEPITHELIAL LESION. - THE ENDOCERVICAL MARGIN IS LESS THAN 1 MM FROM HIGH GRADE SQUAMOUS INTRAEPITHELIAL LESION BUT CLEAR CUT HIGH GRADE DYSPLASIA AT THE INKED MARGIN IS NOT SEEN, LIKELY DUE TO MUCOSAL DENUDATION. - NEGATIVE FOR INVASIVE CARCINOMA. 2. Endocervix, curettage - FRAGMENTS OF BENIGN ENDOCERVICAL MUCOSA. - NEGATIVE FOR DYSPLASIA OR MALIGNANCY.  Assessment:    Doing well postoperatively. S/p LEEP  CIN II  Plan:   1. Continue any current medications as needed. 2. Wound care discussed. 3. Operative findings again reviewed. Pathology report discussed. 4.  Activity restrictions: pelvic rest x 1 week 5. Anticipated return to work: has already returned to work. 6. Follow up: 6 months with [facility] program for repeat pap smear.      Hildred Laser, MD [facility]

## 2018-09-15 NOTE — Progress Notes (Signed)
Talked to patient today and reviewed Dr. Oretha Milch plan to follow up with next pap smear in 6 months through our [facility] program.  I have scheduled her to return on February 16, 2019 @ 11:00 for her next pap.  Reminder letter mailed also.  Will follow-up per [facility] protocol.

## 2018-09-15 NOTE — Progress Notes (Signed)
Pt is present today for a follow up after having a LEEP. Pt stated that she is doing well no complaints.

## 2019-02-16 ENCOUNTER — Ambulatory Visit: Payer: Self-pay

## 2019-03-07 ENCOUNTER — Encounter: Payer: Self-pay | Admitting: Obstetrics and Gynecology

## 2019-03-07 ENCOUNTER — Ambulatory Visit: Payer: Self-pay

## 2019-03-17 ENCOUNTER — Encounter: Payer: Self-pay | Admitting: Obstetrics and Gynecology

## 2019-04-29 ENCOUNTER — Encounter: Payer: Self-pay | Admitting: Obstetrics and Gynecology

## 2019-05-30 ENCOUNTER — Telehealth: Payer: Self-pay

## 2019-05-30 NOTE — Telephone Encounter (Signed)
Pt prescreened no symptoms has face mask.   Coronavirus (COVID-19) Are you at risk?  Are you at risk for the Coronavirus (COVID-19)?  To be considered HIGH RISK for Coronavirus (COVID-19), you have to meet the following criteria:  . Traveled to [place], [place], [place], [place] or [place]; or in the [place] to [place], [place], [place], or [place]; and have fever, cough, and shortness of breath within the last 2 weeks of travel OR . Been in close contact with a person diagnosed with COVID-19 within the last 2 weeks and have fever, cough, and shortness of breath . IF YOU DO NOT MEET THESE CRITERIA, YOU ARE CONSIDERED LOW RISK FOR COVID-19.  What to do if you are HIGH RISK for COVID-19?  . If you are having a medical emergency, call 911. . Seek medical care right away. Before you go to a doctor's office, urgent care or emergency department, call ahead and tell them about your recent travel, contact with someone diagnosed with COVID-19, and your symptoms. You should receive instructions from your physician's office regarding next steps of care.  . When you arrive at healthcare provider, tell the healthcare staff immediately you have returned from visiting [place], [place], [place], [place] or [place]; or traveled in the [place] to [place], [place], [place], or [place]; in the last two weeks or you have been in close contact with a person diagnosed with COVID-19 in the last 2 weeks.   . Tell the health care staff about your symptoms: fever, cough and shortness of breath. . After you have been seen by a medical provider, you will be either: o Tested for (COVID-19) and discharged home on quarantine except to seek medical care if symptoms worsen, and asked to  - Stay home and avoid contact with others until you get your results (4-5 days)  - Avoid travel on public transportation if possible (such as bus, train, or airplane) or o Sent to the Emergency Department by EMS for  evaluation, COVID-19 testing, and possible admission depending on your condition and test results.  What to do if you are LOW RISK for COVID-19?  Reduce your risk of any infection by using the same precautions used for avoiding the common cold or flu:  . Wash your hands often with soap and warm water for at least 20 seconds.  If soap and water are not readily available, use an alcohol-based hand sanitizer with at least 60% alcohol.  . If coughing or sneezing, cover your mouth and nose by coughing or sneezing into the elbow areas of your shirt or coat, into a tissue or into your sleeve (not your hands). . Avoid shaking hands with others and consider head nods or verbal greetings only. . Avoid touching your eyes, nose, or mouth with unwashed hands.  . Avoid close contact with people who are sick. . Avoid places or events with large numbers of people in one location, like concerts or sporting events. . Carefully consider travel plans you have or are making. . If you are planning any travel outside or inside the [place], visit the CDC's Travelers' Health webpage for the latest health notices. . If you have some symptoms but not all symptoms, continue to monitor at home and seek medical attention if your symptoms worsen. . If you are having a medical emergency, call 911.   ADDITIONAL HEALTHCARE OPTIONS FOR PATIENTS  [facility] / e-Visit: [email or web address]           [facility]: [phone]  [facility]: [phone]                   [facility]: [phone]  

## 2019-05-31 ENCOUNTER — Other Ambulatory Visit: Payer: Self-pay

## 2019-05-31 ENCOUNTER — Encounter: Payer: Self-pay | Admitting: Obstetrics and Gynecology

## 2019-05-31 ENCOUNTER — Ambulatory Visit (INDEPENDENT_AMBULATORY_CARE_PROVIDER_SITE_OTHER): Payer: Self-pay | Admitting: Obstetrics and Gynecology

## 2019-05-31 ENCOUNTER — Other Ambulatory Visit: Payer: Self-pay | Admitting: Obstetrics and Gynecology

## 2019-05-31 VITALS — BP 110/66 | HR 80 | Ht 68.0 in | Wt 165.1 lb

## 2019-05-31 DIAGNOSIS — N871 Moderate cervical dysplasia: Secondary | ICD-10-CM

## 2019-05-31 NOTE — Progress Notes (Signed)
Pt is present today for pap smear. Pt stated that she was doing well and having no problems.

## 2019-05-31 NOTE — Progress Notes (Signed)
   GYNECOLOGY CLINIC PROGRESS NOTE Subjective:     Christine Allison is a 38 y.o. P0 woman who comes in today for a  pap smear only. Her most recent annual exam was on July 2019. Her most recent Pap smear was on 06/2018 and showed NILM, but HR HPV+. This was followed by a colposcopy on 07/2018 with benign 5 o'clock biopsy but + ECC (CIN II).  She had a LEEP procedure performed on 08/24/2018, with specimen confirming CIN II on ectocervix, normal ECC. Previous abnormal Pap smears: no. Contraception: none  Review of Systems A comprehensive review of systems was negative.   Objective:    BP 110/66   Pulse 80   Ht 5\' 8"  (1.727 m)   Wt 165 lb 1.6 oz (74.9 kg)   LMP 05/24/2019   BMI 25.10 kg/m  Pelvic Exam: cervix normal in appearance, external genitalia normal, vagina normal without discharge and s/p normal LEEP changes. Pap smear obtained.    Pathology:  1. Cervix, cone, 12 o'clock - HIGH GRADE SQUAMOUS INTRAEPITHELIAL LESION (HSIL, CIN-II) INVOLVING 9 O'CLOCK TO 12 O'CLOCK AND 12 O'CLOCK TO 3 O'CLOCK PORTIONS. - LOW GRADE SQUAMOUS INTRAEPITHELIAL LESION (LSIL, CIN-I) INVOLVING 3 O'CLOCK TO 6 O'CLOCK AND 6 O'CLOCK TO 9 O'CLOCK PORTIONS. - THE ECTOCERVICAL MARGIN IS POSITIVE FOR LOW GRADE SQUAMOUS INTRAEPITHELIAL LESION. - THE ENDOCERVICAL MARGIN IS LESS THAN 1 MM FROM HIGH GRADE SQUAMOUS INTRAEPITHELIAL LESION BUT CLEAR CUT HIGH GRADE DYSPLASIA AT THE INKED MARGIN IS NOT SEEN, LIKELY DUE TO MUCOSAL DENUDATION. - NEGATIVE FOR INVASIVE CARCINOMA. 2. Endocervix, curettage - FRAGMENTS OF BENIGN ENDOCERVICAL MUCOSA. - NEGATIVE FOR DYSPLASIA OR MALIGNANCY. Assessment:   1. CIN II (cervical intraepithelial neoplasia II)  Plan:    Follow up in 6 months for repeat pap smear. If next pap smear normal, can go to once yearly screening x 3 years, then to q 3 year screens.    Rubie Maid, MD [facility]

## 2019-06-11 LAB — PAPLB, HPV, RFX16/18: HPV, high-risk: NEGATIVE

## 2019-06-13 ENCOUNTER — Encounter: Payer: Self-pay | Admitting: *Deleted

## 2019-06-13 NOTE — Progress Notes (Signed)
I have rescreened Christine Allison for [facility] eligibility today, and she is no longer eligible for our program.  She has a follow-up appointment in December that will need to be changed to self pay.  She is aware of this and is willing to pay out of pocket.  I did tell her that usually self paying patients will get a discount.  I have sent [name] at [facility] a message to correct this on her appointment.  [facility] to [place].

## 2019-11-30 ENCOUNTER — Encounter: Payer: Self-pay | Admitting: Obstetrics and Gynecology

## 2020-07-17 ENCOUNTER — Other Ambulatory Visit: Payer: Self-pay

## 2020-07-17 ENCOUNTER — Emergency Department: Payer: Self-pay

## 2020-07-17 ENCOUNTER — Emergency Department
Admission: EM | Admit: 2020-07-17 | Discharge: 2020-07-17 | Disposition: A | Discharge status: Home / Self Care | Payer: Self-pay | Attending: Emergency Medicine | Admitting: Emergency Medicine

## 2020-07-17 ENCOUNTER — Encounter: Payer: Self-pay | Admitting: Intensive Care

## 2020-07-17 DIAGNOSIS — R197 Diarrhea, unspecified: Secondary | ICD-10-CM | POA: Insufficient documentation

## 2020-07-17 DIAGNOSIS — R1012 Left upper quadrant pain: Secondary | ICD-10-CM | POA: Insufficient documentation

## 2020-07-17 DIAGNOSIS — F17210 Nicotine dependence, cigarettes, uncomplicated: Secondary | ICD-10-CM | POA: Insufficient documentation

## 2020-07-17 HISTORY — DX: Other specified behavioral and emotional disorders with onset usually occurring in childhood and adolescence: F98.8

## 2020-07-17 LAB — COMPREHENSIVE METABOLIC PANEL
ALT: 38 U/L (ref 0–44)
AST: 29 U/L (ref 15–41)
Albumin: 4.5 g/dL (ref 3.5–5.0)
Alkaline Phosphatase: 65 U/L (ref 38–126)
Anion gap: 10 (ref 5–15)
BUN: 11 mg/dL (ref 6–20)
CO2: 22 mmol/L (ref 22–32)
Calcium: 9.3 mg/dL (ref 8.9–10.3)
Chloride: 103 mmol/L (ref 98–111)
Creatinine, Ser: 0.79 mg/dL (ref 0.44–1.00)
GFR calc Af Amer: >60 mL/min (ref >60)
GFR calc non Af Amer: >60 mL/min (ref >60)
Glucose, Bld: 94 mg/dL (ref 70–99)
Potassium: 4.1 mmol/L (ref 3.5–5.1)
Sodium: 135 mmol/L (ref 135–145)
Total Bilirubin: 0.9 mg/dL (ref 0.3–1.2)
Total Protein: 7.6 g/dL (ref 6.5–8.1)

## 2020-07-17 LAB — URINALYSIS, COMPLETE (UACMP) WITH MICROSCOPIC
Bacteria, UA: NONE SEEN
Bilirubin Urine: NEGATIVE
Glucose, UA: NEGATIVE mg/dL
Ketones, ur: NEGATIVE mg/dL
Leukocytes,Ua: NEGATIVE
Nitrite: NEGATIVE
Protein, ur: NEGATIVE mg/dL
Specific Gravity, Urine: 1.025 (ref 1.005–1.030)
pH: 5 (ref 5.0–8.0)

## 2020-07-17 LAB — CBC
HCT: 43.7 % (ref 36.0–46.0)
Hemoglobin: 15.8 g/dL — ABNORMAL HIGH (ref 12.0–15.0)
MCH: 34.7 pg — ABNORMAL HIGH (ref 26.0–34.0)
MCHC: 36.2 g/dL — ABNORMAL HIGH (ref 30.0–36.0)
MCV: 96 fL (ref 80.0–100.0)
Platelets: 247 10*3/uL (ref 150–400)
RBC: 4.55 MIL/uL (ref 3.87–5.11)
RDW: 11.9 % (ref 11.5–15.5)
WBC: 12.1 10*3/uL — ABNORMAL HIGH (ref 4.0–10.5)
nRBC: 0 % (ref 0.0–0.2)

## 2020-07-17 LAB — LIPASE, BLOOD: Lipase: 28 U/L (ref 11–51)

## 2020-07-17 LAB — PREGNANCY, URINE: Preg Test, Ur: NEGATIVE

## 2020-07-17 MED ORDER — IOHEXOL 300 MG/ML  SOLN
100.0000 mL | Freq: Once | INTRAMUSCULAR | Status: AC | PRN
  Administered 2020-07-17: 100 mL via INTRAVENOUS
  Filled 2020-07-17: qty 100

## 2020-07-17 MED ORDER — IBUPROFEN 400 MG PO TABS
400.0000 mg | ORAL_TABLET | Freq: Once | ORAL | Status: AC | PRN
  Administered 2020-07-17: 400 mg via ORAL
  Filled 2020-07-17: qty 1

## 2020-07-17 MED ORDER — LIDOCAINE VISCOUS HCL 2 % MT SOLN
15.0000 mL | Freq: Once | OROMUCOSAL | Status: AC
  Administered 2020-07-17: 15 mL via ORAL
  Filled 2020-07-17: qty 15

## 2020-07-17 MED ORDER — ALUM & MAG HYDROXIDE-SIMETH 200-200-20 MG/5ML PO SUSP
30.0000 mL | Freq: Once | ORAL | Status: AC
  Administered 2020-07-17: 30 mL via ORAL
  Filled 2020-07-17: qty 30

## 2020-07-17 MED ORDER — ONDANSETRON 4 MG PO TBDP
4.0000 mg | ORAL_TABLET | Freq: Once | ORAL | Status: AC | PRN
  Administered 2020-07-17: 4 mg via ORAL
  Filled 2020-07-17: qty 1

## 2020-07-17 MED ORDER — FAMOTIDINE 20 MG PO TABS: 20.0000 mg | ORAL_TABLET | Freq: Two times a day (BID) | ORAL | 0 refills | Status: AC

## 2020-07-17 NOTE — ED Provider Notes (Signed)
[facility] Emergency Department Provider Note  ____________________________________________  Time seen: Approximately 7:33 PM  I have reviewed the triage vital signs and the nursing notes.   HISTORY  Chief Complaint Abdominal Pain    HPI Christine Allison is a 39 y.o. female that presents to the emergency department for evaluation of left upper quadrant pain intermittently for 5 years, worse today.  She has chronic diarrhea, which is the same today.  Patient states that she was supposed to have this worked up by primary care but this was put on hold due to Covid.  No urinary symptoms.  Patient has had an appendectomy and also an ovarian cyst removed previously.  She was feeling nauseous but was given nausea medication in triage and this resolved.  Patient had a chicken sandwich today for lunch.  No fevers, SOB, CP vomiting.   Past Medical History:  Diagnosis Date  . ADD (attention deficit disorder)   . Anxiety   . Depression   . Ovarian cyst     Patient Active Problem List   Diagnosis Date Noted  . CIN II (cervical intraepithelial neoplasia II) 05/31/2019  . Self-inflicted laceration of wrist (HCC) 06/28/2016  . Alcohol abuse 06/28/2016  . Bipolar disorder (HCC) 06/28/2016    Past Surgical History:  Procedure Laterality Date  . APPENDECTOMY    . CERVICAL BIOPSY  W/ LOOP ELECTRODE EXCISION      Prior to Admission medications   Medication Sig Start Date End Date Taking? Authorizing Provider  famotidine (PEPCID) 20 MG tablet Take 1 tablet (20 mg total) by mouth 2 (two) times daily. 07/17/20 07/17/21  Enid Derry, PA-C  Multiple Vitamin (MULTIVITAMIN) tablet Take 1 tablet by mouth daily.    [provider]    Allergies Patient has no known allergies.  Family History  Problem Relation Age of Onset  . Hyperlipidemia Mother   . Hypertension Mother   . Hyperlipidemia Father     Social History Social History   Tobacco Use  . Smoking  status: Current Every Day Smoker    Packs/day: 0.50    Types: Cigarettes  . Smokeless tobacco: Never Used  Vaping Use  . Vaping Use: Never used  Substance Use Topics  . Alcohol use: Yes    Alcohol/week: 35.0 standard drinks    Types: 35 Cans of beer per week    Comment: 5 beers a day  . Drug use: Yes    Types: Marijuana, Cocaine     Review of Systems  Constitutional: No fever/chills Respiratory: No SOB. Gastrointestinal: Positive for left upper quadrant pain and diarrhea.  No vomiting.  Musculoskeletal: Negative for musculoskeletal pain. Skin: Negative for rash, abrasions, lacerations, ecchymosis. Neurological: Negative for headaches   ____________________________________________   PHYSICAL EXAM:  VITAL SIGNS: ED Triage Vitals  Enc Vitals Group     BP 07/17/20 1623 100/86     Pulse Rate 07/17/20 1623 84     Resp 07/17/20 1623 16     Temp 07/17/20 1623 99.2 F (37.3 C)     Temp Source 07/17/20 1623 Oral     SpO2 07/17/20 1623 98 %     Weight 07/17/20 1624 160 lb (72.6 kg)     Height 07/17/20 1624 5\' 8"  (1.727 m)     Head Circumference --      Peak Flow --      Pain Score 07/17/20 1623 5     Pain Loc --      Pain Edu? --  Excl. in GC? --      Constitutional: Alert and oriented. Well appearing and in no acute distress. Eyes: Conjunctivae are normal. PERRL. EOMI. Head: Atraumatic. ENT:      Ears:      Nose: No congestion/rhinnorhea.      Mouth/Throat: Mucous membranes are moist.  Neck: No stridor. Cardiovascular: Normal rate, regular rhythm.  Good peripheral circulation. Respiratory: Normal respiratory effort without tachypnea or retractions. Lungs CTAB. Good air entry to the bases with no decreased or absent breath sounds. Gastrointestinal: Bowel sounds 4 quadrants.  Left upper quadrant tenderness to palpation. No guarding or rigidity. No palpable masses. No distention.  Musculoskeletal: Full range of motion to all extremities. No gross deformities  appreciated. Neurologic:  Normal speech and language. No gross focal neurologic deficits are appreciated.  Skin:  Skin is warm, dry and intact. No rash noted. Psychiatric: Mood and affect are normal. Speech and behavior are normal. Patient exhibits appropriate insight and judgement.   ____________________________________________   LABS (all labs ordered are listed, but only abnormal results are displayed)  Labs Reviewed  CBC - Abnormal; Notable for the following components:      Result Value   WBC 12.1 (*)    Hemoglobin 15.8 (*)    MCH 34.7 (*)    MCHC 36.2 (*)    All other components within normal limits  URINALYSIS, COMPLETE (UACMP) WITH MICROSCOPIC - Abnormal; Notable for the following components:   Color, Urine YELLOW (*)    APPearance HAZY (*)    Hgb urine dipstick MODERATE (*)    All other components within normal limits  LIPASE, BLOOD  COMPREHENSIVE METABOLIC PANEL  PREGNANCY, URINE  POC URINE PREG, ED   ____________________________________________  EKG  SR ____________________________________________  RADIOLOGY  CT ABDOMEN PELVIS W CONTRAST  Result Date: 07/17/2020 CLINICAL DATA:  Upper abdominal pain with nausea EXAM: CT ABDOMEN AND PELVIS WITH CONTRAST TECHNIQUE: Multidetector CT imaging of the abdomen and pelvis was performed using the standard protocol following bolus administration of intravenous contrast. CONTRAST:  OMNIPAQUE IOHEXOL 300 MG/ML  SOLN COMPARISON:  CT 09/16/2007 FINDINGS: Lower chest: No acute abnormality. Hepatobiliary: Mild steatosis. No calcified gallstone or biliary dilatation. Pancreas: Unremarkable. No pancreatic ductal dilatation or surrounding inflammatory changes. Spleen: Normal in size without focal abnormality. Adrenals/Urinary Tract: Adrenal glands are unremarkable. Kidneys are normal, without renal calculi, focal lesion, or hydronephrosis. Bladder is unremarkable. Stomach/Bowel: Stomach is within normal limits. Status post  appendectomy. No evidence of bowel wall thickening, distention, or inflammatory changes. Vascular/Lymphatic: No significant vascular findings are present. No enlarged abdominal or pelvic lymph nodes. Reproductive: Probable arcuate configuration of the uterus. No suspicious adnexal mass Other: Small free fluid in the pelvis.  No free air Musculoskeletal: No acute or significant osseous findings. IMPRESSION: 1. No CT evidence for acute intra-abdominal or pelvic abnormality. 2. Mild hepatic steatosis. Electronically Signed   By: Jasmine Pang M.D.   On: 07/17/2020 22:32    ____________________________________________    PROCEDURES  Procedure(s) performed:    Procedures    Medications  alum & mag hydroxide-simeth (MAALOX/MYLANTA) 200-200-20 MG/5ML suspension 30 mL (has no administration in time range)    And  lidocaine (XYLOCAINE) 2 % viscous mouth solution 15 mL (has no administration in time range)  ondansetron (ZOFRAN-ODT) disintegrating tablet 4 mg (4 mg Oral Given 07/17/20 1631)  ibuprofen (ADVIL) tablet 400 mg (400 mg Oral Given 07/17/20 1631)  iohexol (OMNIPAQUE) 300 MG/ML solution 100 mL (100 mLs Intravenous Contrast Given 07/17/20 2214)  ____________________________________________   INITIAL IMPRESSION / ASSESSMENT AND PLAN / ED COURSE  Pertinent labs & imaging results that were available during my care of the patient were reviewed by me and considered in my medical decision making (see chart for details).  Review of the  CSRS was performed in accordance of the [facility] prior to dispensing any controlled drugs.     Patient presented to the emergency department for evaluation of acute on chronic left upper quadrant pain.  Vital signs and exam are reassuring.  Lab work is largely unremarkable.  Patient has left upper quadrant tenderness with palpation.  CT scan is negative for acute abnormalities.  She has a history of heartburn and suspect that this is contributing.  She states  that pain has been on and off for 5 years.  Patient will be discharged home with prescriptions for Pepcid. Patient is to follow up with primary care as directed. Patient is given ED precautions to return to the ED for any worsening or new symptoms.   ELIRA COLASANTI was evaluated in Emergency Department on 07/17/2020 for the symptoms described in the history of present illness. She was evaluated in the context of the global COVID-19 pandemic, which necessitated consideration that the patient might be at risk for infection with the SARS-CoV-2 virus that causes COVID-19. Institutional protocols and algorithms that pertain to the evaluation of patients at risk for COVID-19 are in a state of rapid change based on information released by regulatory bodies including the CDC and federal and state organizations. These policies and algorithms were followed during the patient's care in the ED.  ____________________________________________  FINAL CLINICAL IMPRESSION(S) / ED DIAGNOSES  Final diagnoses:  Left upper quadrant abdominal pain      NEW MEDICATIONS STARTED DURING THIS VISIT:  ED Discharge Orders         Ordered    famotidine (PEPCID) 20 MG tablet  2 times daily     Discontinue  Reprint     07/17/20 2243              This chart was dictated using voice recognition software/Dragon. Despite best efforts to proofread, errors can occur which can change the meaning. Any change was purely unintentional.    Enid Derry, PA-C 07/17/20 2312    Jene Every, MD 07/17/20 [phone]

## 2020-07-17 NOTE — ED Triage Notes (Signed)
Patient c/o upper abd pain with nausea. Denies diarrhea.

## 2021-04-25 DIAGNOSIS — R45851 Suicidal ideations: Secondary | ICD-10-CM | POA: Insufficient documentation

## 2021-04-25 DIAGNOSIS — Y908 Blood alcohol level of 240 mg/100 ml or more: Secondary | ICD-10-CM | POA: Insufficient documentation

## 2021-04-25 DIAGNOSIS — F332 Major depressive disorder, recurrent severe without psychotic features: Secondary | ICD-10-CM | POA: Insufficient documentation

## 2021-04-25 DIAGNOSIS — F17210 Nicotine dependence, cigarettes, uncomplicated: Secondary | ICD-10-CM | POA: Insufficient documentation

## 2021-04-25 DIAGNOSIS — F10129 Alcohol abuse with intoxication, unspecified: Secondary | ICD-10-CM | POA: Insufficient documentation

## 2021-04-25 DIAGNOSIS — Z20822 Contact with and (suspected) exposure to covid-19: Secondary | ICD-10-CM | POA: Insufficient documentation

## 2021-04-26 ENCOUNTER — Emergency Department
Admission: EM | Admit: 2021-04-26 | Discharge: 2021-04-26 | Disposition: A | Discharge status: Other Acute Inpatient Hospital | Payer: Self-pay | Attending: Emergency Medicine | Admitting: Emergency Medicine

## 2021-04-26 ENCOUNTER — Other Ambulatory Visit: Payer: Self-pay

## 2021-04-26 ENCOUNTER — Inpatient Hospital Stay
Admission: RE | Admit: 2021-04-26 | Discharge: 2021-04-29 | DRG: 885 | Disposition: A | Discharge status: Home / Self Care | Payer: Self-pay | Source: Intra-hospital | Attending: Behavioral Health | Admitting: Behavioral Health

## 2021-04-26 ENCOUNTER — Encounter: Payer: Self-pay | Admitting: Psychiatry

## 2021-04-26 DIAGNOSIS — F332 Major depressive disorder, recurrent severe without psychotic features: Principal | ICD-10-CM | POA: Diagnosis present

## 2021-04-26 DIAGNOSIS — R45851 Suicidal ideations: Secondary | ICD-10-CM | POA: Diagnosis present

## 2021-04-26 DIAGNOSIS — Z20822 Contact with and (suspected) exposure to covid-19: Secondary | ICD-10-CM | POA: Diagnosis present

## 2021-04-26 DIAGNOSIS — Z79899 Other long term (current) drug therapy: Secondary | ICD-10-CM

## 2021-04-26 DIAGNOSIS — F411 Generalized anxiety disorder: Secondary | ICD-10-CM | POA: Diagnosis present

## 2021-04-26 DIAGNOSIS — F10929 Alcohol use, unspecified with intoxication, unspecified: Secondary | ICD-10-CM

## 2021-04-26 DIAGNOSIS — Z9151 Personal history of suicidal behavior: Secondary | ICD-10-CM

## 2021-04-26 DIAGNOSIS — F1020 Alcohol dependence, uncomplicated: Secondary | ICD-10-CM | POA: Diagnosis present

## 2021-04-26 DIAGNOSIS — F17210 Nicotine dependence, cigarettes, uncomplicated: Secondary | ICD-10-CM | POA: Diagnosis present

## 2021-04-26 DIAGNOSIS — F603 Borderline personality disorder: Secondary | ICD-10-CM | POA: Diagnosis present

## 2021-04-26 DIAGNOSIS — F1210 Cannabis abuse, uncomplicated: Secondary | ICD-10-CM | POA: Diagnosis present

## 2021-04-26 DIAGNOSIS — F319 Bipolar disorder, unspecified: Secondary | ICD-10-CM | POA: Diagnosis present

## 2021-04-26 DIAGNOSIS — F1010 Alcohol abuse, uncomplicated: Secondary | ICD-10-CM | POA: Diagnosis present

## 2021-04-26 DIAGNOSIS — F1410 Cocaine abuse, uncomplicated: Secondary | ICD-10-CM | POA: Diagnosis present

## 2021-04-26 DIAGNOSIS — G4700 Insomnia, unspecified: Secondary | ICD-10-CM | POA: Diagnosis present

## 2021-04-26 LAB — URINE DRUG SCREEN, QUALITATIVE (ARMC ONLY)
Amphetamines, Ur Screen: NOT DETECTED
Barbiturates, Ur Screen: NOT DETECTED
Benzodiazepine, Ur Scrn: NOT DETECTED
Cannabinoid 50 Ng, Ur ~~LOC~~: NOT DETECTED
Cocaine Metabolite,Ur ~~LOC~~: NOT DETECTED
MDMA (Ecstasy)Ur Screen: NOT DETECTED
Methadone Scn, Ur: NOT DETECTED
Opiate, Ur Screen: NOT DETECTED
Phencyclidine (PCP) Ur S: NOT DETECTED
Tricyclic, Ur Screen: NOT DETECTED

## 2021-04-26 LAB — COMPREHENSIVE METABOLIC PANEL
ALT: 40 U/L (ref 0–44)
AST: 29 U/L (ref 15–41)
Albumin: 4.3 g/dL (ref 3.5–5.0)
Alkaline Phosphatase: 60 U/L (ref 38–126)
Anion gap: 11 (ref 5–15)
BUN: 8 mg/dL (ref 6–20)
CO2: 20 mmol/L — ABNORMAL LOW (ref 22–32)
Calcium: 9.5 mg/dL (ref 8.9–10.3)
Chloride: 105 mmol/L (ref 98–111)
Creatinine, Ser: 0.72 mg/dL (ref 0.44–1.00)
GFR, Estimated: >60 mL/min (ref >60)
Glucose, Bld: 100 mg/dL — ABNORMAL HIGH (ref 70–99)
Potassium: 3.4 mmol/L — ABNORMAL LOW (ref 3.5–5.1)
Sodium: 136 mmol/L (ref 135–145)
Total Bilirubin: 0.8 mg/dL (ref 0.3–1.2)
Total Protein: 7.6 g/dL (ref 6.5–8.1)

## 2021-04-26 LAB — CBC
HCT: 40.8 % (ref 36.0–46.0)
Hemoglobin: 15.1 g/dL — ABNORMAL HIGH (ref 12.0–15.0)
MCH: 34.2 pg — ABNORMAL HIGH (ref 26.0–34.0)
MCHC: 37 g/dL — ABNORMAL HIGH (ref 30.0–36.0)
MCV: 92.3 fL (ref 80.0–100.0)
Platelets: 253 10*3/uL (ref 150–400)
RBC: 4.42 MIL/uL (ref 3.87–5.11)
RDW: 11.5 % (ref 11.5–15.5)
WBC: 9 10*3/uL (ref 4.0–10.5)
nRBC: 0 % (ref 0.0–0.2)

## 2021-04-26 LAB — URINALYSIS, COMPLETE (UACMP) WITH MICROSCOPIC
Bilirubin Urine: NEGATIVE
Glucose, UA: NEGATIVE mg/dL
Hgb urine dipstick: NEGATIVE
Ketones, ur: NEGATIVE mg/dL
Leukocytes,Ua: NEGATIVE
Nitrite: NEGATIVE
Protein, ur: NEGATIVE mg/dL
Specific Gravity, Urine: 1.001 — ABNORMAL LOW (ref 1.005–1.030)
pH: 6 (ref 5.0–8.0)

## 2021-04-26 LAB — POC URINE PREG, ED: Preg Test, Ur: NEGATIVE

## 2021-04-26 LAB — RESP PANEL BY RT-PCR (FLU A&B, COVID) ARPGX2
Influenza A by PCR: NEGATIVE
Influenza B by PCR: NEGATIVE
SARS Coronavirus 2 by RT PCR: NEGATIVE

## 2021-04-26 LAB — ETHANOL: Alcohol, Ethyl (B): 250 mg/dL — ABNORMAL HIGH (ref <10)

## 2021-04-26 MED ORDER — CITALOPRAM HYDROBROMIDE 20 MG PO TABS: 10.0000 mg | ORAL_TABLET | Freq: Every day | ORAL | Status: DC

## 2021-04-26 MED ORDER — MAGNESIUM HYDROXIDE 400 MG/5ML PO SUSP
30.0000 mL | Freq: Every day | ORAL | Status: DC | PRN
  Administered 2021-04-28: 30 mL via ORAL
  Filled 2021-04-26: qty 30

## 2021-04-26 MED ORDER — BUPROPION HCL ER (XL) 150 MG PO TB24
150.0000 mg | ORAL_TABLET | Freq: Every day | ORAL | Status: DC
  Administered 2021-04-27 – 2021-04-29 (×3): 150 mg via ORAL
  Filled 2021-04-26 (×3): qty 1

## 2021-04-26 MED ORDER — ADULT MULTIVITAMIN W/MINERALS CH
1.0000 | ORAL_TABLET | Freq: Every day | ORAL | Status: DC
  Administered 2021-04-27 – 2021-04-29 (×3): 1 via ORAL
  Filled 2021-04-26 (×3): qty 1

## 2021-04-26 MED ORDER — FAMOTIDINE 20 MG PO TABS
20.0000 mg | ORAL_TABLET | Freq: Two times a day (BID) | ORAL | Status: DC
  Administered 2021-04-26 – 2021-04-29 (×6): 20 mg via ORAL
  Filled 2021-04-26 (×6): qty 1

## 2021-04-26 MED ORDER — ALUM & MAG HYDROXIDE-SIMETH 200-200-20 MG/5ML PO SUSP: 30.0000 mL | ORAL | Status: DC | PRN

## 2021-04-26 MED ORDER — ACETAMINOPHEN 325 MG PO TABS
650.0000 mg | ORAL_TABLET | Freq: Four times a day (QID) | ORAL | Status: DC | PRN
  Administered 2021-04-28 (×2): 650 mg via ORAL
  Filled 2021-04-26 (×2): qty 2

## 2021-04-26 MED ORDER — NICOTINE 21 MG/24HR TD PT24
21.0000 mg | MEDICATED_PATCH | Freq: Every day | TRANSDERMAL | Status: DC
  Administered 2021-04-26 – 2021-04-29 (×4): 21 mg via TRANSDERMAL
  Filled 2021-04-26 (×4): qty 1

## 2021-04-26 MED ORDER — HYDROXYZINE HCL 25 MG PO TABS
25.0000 mg | ORAL_TABLET | Freq: Every evening | ORAL | Status: DC | PRN
  Administered 2021-04-26 – 2021-04-28 (×3): 25 mg via ORAL
  Filled 2021-04-26 (×3): qty 1

## 2021-04-26 MED ORDER — THIAMINE HCL 100 MG PO TABS
100.0000 mg | ORAL_TABLET | Freq: Every day | ORAL | Status: DC
  Administered 2021-04-27 – 2021-04-29 (×3): 100 mg via ORAL
  Filled 2021-04-26 (×3): qty 1

## 2021-04-26 MED ORDER — HYDROXYZINE HCL 25 MG PO TABS: 25.0000 mg | ORAL_TABLET | Freq: Every evening | ORAL | Status: DC | PRN

## 2021-04-26 MED ORDER — MECLIZINE HCL 12.5 MG PO TABS
12.5000 mg | ORAL_TABLET | Freq: Three times a day (TID) | ORAL | Status: DC | PRN
  Filled 2021-04-26: qty 1

## 2021-04-26 MED ORDER — NALTREXONE HCL 50 MG PO TABS
25.0000 mg | ORAL_TABLET | Freq: Every day | ORAL | Status: DC
  Administered 2021-04-27 – 2021-04-28 (×2): 25 mg via ORAL
  Filled 2021-04-26 (×3): qty 1

## 2021-04-26 NOTE — BHH Suicide Risk Assessment (Signed)
[facility] Admission Suicide Risk Assessment   Nursing information obtained from:  Family Demographic factors:  Caucasian Current Mental Status:  NA Loss Factors:  Loss of significant relationship Historical Factors:  Impulsivity Risk Reduction Factors:  Responsible for children under 38 years of age,Living with another person, especially a relative  Total Time spent with patient: 1 hour Principal Problem: Major depressive disorder, recurrent severe without psychotic features (HCC) Diagnosis:  Principal Problem:   Major depressive disorder, recurrent severe without psychotic features (HCC) Active Problems:   Borderline personality disorder (HCC)   Alcohol use disorder, moderate, dependence (HCC)   Generalized anxiety disorder  Subjective Data: 40 year old female with MDD, BPD, and AUD presenting to emergency room for suicidal ideations. Patient seen one-on-one today. She presents as anxious and dysphoric, and becomes tearful during examination. She notes multiple life stressors happening in her life. Her mother recently developed weirnikes korsokoff syndrome, and is now in a memory care unit. 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 Her step daughter that has been in her life for the past 8 years is also moving away to be with her mother next week. She has been drinking more recently, and was having suicidal thoughts to cut her neck. She notes she is still having passive SI today, but is now able to contract for safety. She denies any HI/VH/AH. She has been on numerous medications in the past, and felt Wellbutrin was the most helpful without causing side effects. She notes that drinking is contributing to her decline in mental health, but she is not ready to cease drinking completely. She was open to starting Naltrexone to help curb her alcohol cravings and binge drinking. She is also hoping to set up therapy prior to discharge. She states she has a good support system with her husband,  brother, and friends but these stressors became too much for her to handle.   Continued Clinical Symptoms:  Alcohol Use Disorder Identification Test Final Score (AUDIT): 8 The "Alcohol Use Disorders Identification Test", Guidelines for Use in Primary Care, Second Edition.  World Science writer [facility]). Score between 0-7:  no or low risk or alcohol related problems. Score between 8-15:  moderate risk of alcohol related problems. Score between 16-19:  high risk of alcohol related problems. Score 20 or above:  warrants further diagnostic evaluation for alcohol dependence and treatment.   CLINICAL FACTORS:   Severe Anxiety and/or Agitation Depression:   Comorbid alcohol abuse/dependence Hopelessness Impulsivity Insomnia Severe Alcohol/Substance Abuse/Dependencies Personality Disorders:   Cluster B Previous Psychiatric Diagnoses and Treatments   Musculoskeletal: Strength & Muscle Tone: within normal limits Gait & Station: normal Patient leans: N/A  Psychiatric Specialty Exam:  Presentation  General Appearance: Disheveled  Eye Contact:Fair  Speech:Clear and Coherent  Speech Volume:Normal  Handedness:Right   Mood and Affect  Mood:Anxious; Dysphoric  Affect:Congruent; Tearful   Thought Process  Thought Processes:Coherent  Descriptions of Associations:Intact  Orientation:Full (Time, Place and Person)  Thought Content:Logical  History of Schizophrenia/Schizoaffective disorder:No  Duration of Psychotic Symptoms:No data recorded Hallucinations:Hallucinations: None  Ideas of Reference:None  Suicidal Thoughts:Suicidal Thoughts: Yes, Passive SI Passive Intent and/or Plan: Without Plan; Without Intent  Homicidal Thoughts:Homicidal Thoughts: No   Sensorium  Memory:Immediate Good; Recent Good; Remote Good  Judgment:Fair  Insight:Fair   Executive Functions  Concentration:Good  Attention Span:Good  Recall:Good  Fund of  Knowledge:Good  Language:Good   Psychomotor Activity  Psychomotor Activity:Psychomotor Activity: Restlessness   Assets  Assets:Communication Skills; Desire for Improvement; Leisure Time; Physical  Health; Resilience; Social Support   Sleep  Sleep:Sleep: Fair    Physical Exam: Physical Exam ROS Blood pressure 130/80, pulse 79, temperature 98.9 F (37.2 C), temperature source Oral, resp. rate 18, height 5\' 8"  (1.727 m), weight 71.7 kg, last menstrual period 04/10/2021, SpO2 98 %. Body mass index is 24.02 kg/m.   COGNITIVE FEATURES THAT CONTRIBUTE TO RISK:  Thought constriction (tunnel vision)    SUICIDE RISK:   Moderate:  Frequent suicidal ideation with limited intensity, and duration, some specificity in terms of plans, no associated intent, good self-control, limited dysphoria/symptomatology, some risk factors present, and identifiable protective factors, including available and accessible social support.  PLAN OF CARE: Continue inpatient admission, see H&P for details.   I certify that inpatient services furnished can reasonably be expected to improve the patient's condition.   04/12/2021, MD 04/26/2021, 4:14 PM

## 2021-04-26 NOTE — ED Notes (Signed)
Report given to Morrie Sheldon, [place]. Pt transported to [facility] via wheelchair by this RN and security. AOx4, breathing regular and unlabored

## 2021-04-26 NOTE — BH Assessment (Signed)
Patient is to be admitted to [facility] BMU by Dr. Nanine Means.  Attending Physician will be Dr. Neale Burly.   Patient has been assigned to room 320, by [facility] Charge Nurse Gigi.   Intake Paper Work has been signed and placed on patient chart.  ER staff is aware of the admission:  Inetta Fermo, ER Rogelia Boga, Patient's Nurse   Miya, Patient Access.   Bed available at 3pm

## 2021-04-26 NOTE — ED Notes (Signed)
breakfast. Shower offered. Patient declined this morning , states will take a shower later today.

## 2021-04-26 NOTE — ED Notes (Signed)
Assumed care of pt upon being roomed, pt ambulated to room with steady gait. Denies drinking daily, states "maybe 3 times a week, but heavily when I do." Denies hx of ETOH withdrawal. AOx4, breathing regular and unlabored. Pt tearful. States last inpatient psychiatric tx was 13-14 years ago but has since had SI attempts and has not asked help. Denies medication use. Glasses at pt bedside. Provided with water and two warm blankets for comfort.

## 2021-04-26 NOTE — ED Notes (Signed)
Belonging removed Cell phone Silver wedding band Dress [employer] All locked in [place] locked unit

## 2021-04-26 NOTE — ED Notes (Signed)
IVC / Consult completed/ Pending Placement 

## 2021-04-26 NOTE — Progress Notes (Signed)
Patient admitted from ED with alcohol abuse and suicidal ideations.Patient stated that she is overwhelmed with her stresses. Patient verbalized her stresses are her step daughter who is with her for 8 years is moving out to her mom, she is unemployed and her mother is sick. Patient sad and tearful but cooperative during admission assessment. Patient verbalized passive SI.Contracts for safety . Patient denies HI and AVH. Patient informed of fall risk status, fall risk assessed "low" at this time. Patient oriented to unit/staff/room. Patient denies any questions/concerns at this time. Patient safe on unit with Q15 minute checks for safety. Skin assessment and body search done.No contraband found.
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
## 2021-04-26 NOTE — H&P (Signed)
Psychiatric Admission Assessment Adult  Patient Identification: Christine Allison MRN:  [id number] Date of Evaluation:  04/26/2021 Chief Complaint:  Major depressive disorder, recurrent severe without psychotic features (HCC) [F33.2] Principal Diagnosis: Major depressive disorder, recurrent severe without psychotic features (HCC) Diagnosis:  Principal Problem:   Major depressive disorder, recurrent severe without psychotic features (HCC) Active Problems:   Borderline personality disorder (HCC)   Alcohol use disorder, moderate, dependence (HCC)   Generalized anxiety disorder  CC "Just got overwhelmed."  History of Present Illness: [age] year old female with MDD, BPD, and AUD presenting to emergency room for suicidal ideations. Patient seen one-on-one today. She presents as anxious and dysphoric, and becomes tearful during examination. She notes multiple life stressors happening in her life. Her mother recently developed weirnikes korsokoff syndrome, and is now in a memory care unit. 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 Her step daughter that has been in her life for the past 8 years is also moving away to be with her mother next week. She has been drinking more recently, and was having suicidal thoughts to cut her neck. She notes she is still having passive SI today, but is now able to contract for safety. She denies any HI/VH/AH. She has been on numerous medications in the past, and felt Wellbutrin was the most helpful without causing side effects. She notes that drinking is contributing to her decline in mental health, but she is not ready to cease drinking completely. She was open to starting Naltrexone to help curb her alcohol cravings and binge drinking. She is also hoping to set up therapy prior to discharge. She states she has a good support system with her husband, brother, and friends but these stressors became too much for her to handle.   Associated Signs/Symptoms: Depression  Symptoms:  depressed mood, insomnia, fatigue, difficulty concentrating, hopelessness, recurrent thoughts of death, suicidal thoughts with specific plan, anxiety, Duration of Depression Symptoms: Greater than two weeks  (Hypo) Manic Symptoms:  Impulsivity, Anxiety Symptoms:  Excessive Worry, Panic Symptoms, Psychotic Symptoms:  Denies PTSD Symptoms: Negative Total Time spent with patient: 1 hour  Past Psychiatric History: Patient previously seen by [facility] and [facility] for outpatient services. Not seeing anyone currently, and was not on any medications prior to admission. She has tried numerous SSRIs and SNRI's in the past. She felt Wellbutrin was the most helpful past medication. She has done therapy in the past, and found CBT to be helpful. She has two prior suicide attempts via overdose and via driving car into embankment. No history of violence.   Is the patient at risk to self? Yes.    Has the patient been a risk to self in the past 6 months? Yes.    Has the patient been a risk to self within the distant past? Yes.    Is the patient a risk to others? No.  Has the patient been a risk to others in the past 6 months? No.  Has the patient been a risk to others within the distant past? No.   Prior Inpatient Therapy:   Prior Outpatient Therapy:    Alcohol Screening: 1. How often do you have a drink containing alcohol?: 4 or more times a week 2. How many drinks containing alcohol do you have on a typical day when you are drinking?: 5 or 6 3. How often do you have six or more drinks on one occasion?: Less than monthly AUDIT-C Score: 7 4. How often during the  last year have you found that you were not able to stop drinking once you had started?: Less than monthly 5. How often during the last year have you failed to do what was normally expected from you because of drinking?: Never 6. How often during the last year have you needed a first drink in the morning to get yourself going  after a heavy drinking session?: Never 7. How often during the last year have you had a feeling of guilt of remorse after drinking?: Never 8. How often during the last year have you been unable to remember what happened the night before because you had been drinking?: Never 9. Have you or someone else been injured as a result of your drinking?: No 10. Has a relative or friend or a doctor or another health worker been concerned about your drinking or suggested you cut down?: No Alcohol Use Disorder Identification Test Final Score (AUDIT): 8 Alcohol Brief Interventions/Follow-up: Alcohol education/Brief advice Substance Abuse History in the last 12 months:  Yes.   Consequences of Substance Abuse: Worsening mental health Previous Psychotropic Medications: Yes  Psychological Evaluations: Yes  Past Medical History:  Past Medical History:  Diagnosis Date  . ADD (attention deficit disorder)   . Anxiety   . Depression   . Ovarian cyst     Past Surgical History:  Procedure Laterality Date  . APPENDECTOMY    . CERVICAL BIOPSY  W/ LOOP ELECTRODE EXCISION     Family History:  Family History  Problem Relation Age of Onset  . Hyperlipidemia Mother   . Hypertension Mother   . Hyperlipidemia Father    Family Psychiatric  History: Father with alcohol use disorder Tobacco Screening: Have you used any form of tobacco in the last 30 days? (Cigarettes, Smokeless Tobacco, Cigars, and/or Pipes): Yes Tobacco use, Select all that apply: 5 or more cigarettes per day Are you interested in Tobacco Cessation Medications?: Yes, will notify MD for an order Counseled patient on smoking cessation including recognizing danger situations, developing coping skills and basic information about quitting provided: Yes Social History:  Social History   Substance and Sexual Activity  Alcohol Use Yes  . Alcohol/week: 35.0 standard drinks  . Types: 35 Cans of beer per week   Comment: 5 beers a day     Social  History   Substance and Sexual Activity  Drug Use Yes  . Types: Marijuana, Cocaine    Additional Social History:                           Allergies:   Allergies  Allergen Reactions  . Amoxicillin Rash   Lab Results:  Results for orders placed or performed during the hospital encounter of 04/26/21 (from the past 48 hour(s))  CBC     Status: Abnormal   Collection Time: 04/26/21 12:30 AM  Result Value Ref Range   WBC 9.0 4.0 - 10.5 K/uL   RBC 4.42 3.87 - 5.11 MIL/uL   Hemoglobin 15.1 (H) 12.0 - 15.0 g/dL   HCT 45.4 09.8 - 11.9 %   MCV 92.3 80.0 - 100.0 fL   MCH 34.2 (H) 26.0 - 34.0 pg   MCHC 37.0 (H) 30.0 - 36.0 g/dL   RDW 14.7 82.9 - 56.2 %   Platelets 253 150 - 400 K/uL   nRBC 0.0 0.0 - 0.2 %    Comment: Performed at [facility], [address] [place], [place] [address]  Comprehensive  metabolic panel     Status: Abnormal   Collection Time: 04/26/21 12:30 AM  Result Value Ref Range   Sodium 136 135 - 145 mmol/L   Potassium 3.4 (L) 3.5 - 5.1 mmol/L   Chloride 105 98 - 111 mmol/L   CO2 20 (L) 22 - 32 mmol/L   Glucose, Bld 100 (H) 70 - 99 mg/dL    Comment: Glucose reference range applies only to samples taken after fasting for at least 8 hours.   BUN 8 6 - 20 mg/dL   Creatinine, Ser 0.25 0.44 - 1.00 mg/dL   Calcium 9.5 8.9 - 42.7 mg/dL   Total Protein 7.6 6.5 - 8.1 g/dL   Albumin 4.3 3.5 - 5.0 g/dL   AST 29 15 - 41 U/L   ALT 40 0 - 44 U/L   Alkaline Phosphatase 60 38 - 126 U/L   Total Bilirubin 0.8 0.3 - 1.2 mg/dL   GFR, Estimated >06 >23 mL/min    Comment: (NOTE) Calculated using the CKD-EPI Creatinine Equation (2021)    Anion gap 11 5 - 15    Comment: Performed at [facility], [address] [place], [place] [address]  Ethanol     Status: Abnormal   Collection Time: 04/26/21 12:30 AM  Result Value Ref Range   Alcohol, Ethyl (B) 250 (H) <10 mg/dL    Comment: (NOTE) Lowest detectable limit for serum alcohol is 10 mg/dL.  For  medical purposes only. Performed at [facility], [address] [place], [place] [address]   POC Urine Pregnancy, ED     Status: None   Collection Time: 04/26/21  1:15 AM  Result Value Ref Range   Preg Test, Ur NEGATIVE NEGATIVE    Comment:        THE SENSITIVITY OF THIS METHODOLOGY IS >24 mIU/mL   Urinalysis, Complete w Microscopic Urine, Clean Catch     Status: Abnormal   Collection Time: 04/26/21  1:16 AM  Result Value Ref Range   Color, Urine STRAW (A) YELLOW   APPearance CLEAR (A) CLEAR   Specific Gravity, Urine 1.001 (L) 1.005 - 1.030   pH 6.0 5.0 - 8.0   Glucose, UA NEGATIVE NEGATIVE mg/dL   Hgb urine dipstick NEGATIVE NEGATIVE   Bilirubin Urine NEGATIVE NEGATIVE   Ketones, ur NEGATIVE NEGATIVE mg/dL   Protein, ur NEGATIVE NEGATIVE mg/dL   Nitrite NEGATIVE NEGATIVE   Leukocytes,Ua NEGATIVE NEGATIVE   RBC / HPF 0-5 0 - 5 RBC/hpf   WBC, UA 0-5 0 - 5 WBC/hpf   Bacteria, UA RARE (A) NONE SEEN   Squamous Epithelial / LPF 0-5 0 - 5    Comment: Performed at [facility], [address] [place], [place] [address]  Urine Drug Screen, Qualitative ([facility] only)     Status: None   Collection Time: 04/26/21  1:16 AM  Result Value Ref Range   Tricyclic, Ur Screen NONE DETECTED NONE DETECTED   Amphetamines, Ur Screen NONE DETECTED NONE DETECTED   MDMA (Ecstasy)Ur Screen NONE DETECTED NONE DETECTED   Cocaine Metabolite,Ur [place] NONE DETECTED NONE DETECTED   Opiate, Ur Screen NONE DETECTED NONE DETECTED   Phencyclidine (PCP) Ur S NONE DETECTED NONE DETECTED   Cannabinoid 50 Ng, Ur  NONE DETECTED NONE DETECTED   Barbiturates, Ur Screen NONE DETECTED NONE DETECTED   Benzodiazepine, Ur Scrn NONE DETECTED NONE DETECTED   Methadone Scn, Ur NONE DETECTED NONE DETECTED    Comment: (NOTE) Tricyclics + metabolites, urine  Cutoff 1000 ng/mL Amphetamines + metabolites, urine  Cutoff 1000 ng/mL MDMA (Ecstasy), urine              Cutoff 500 ng/mL Cocaine Metabolite,  urine          Cutoff 300 ng/mL Opiate + metabolites, urine        Cutoff 300 ng/mL Phencyclidine (PCP), urine         Cutoff 25 ng/mL Cannabinoid, urine                 Cutoff 50 ng/mL Barbiturates + metabolites, urine  Cutoff 200 ng/mL Benzodiazepine, urine              Cutoff 200 ng/mL Methadone, urine                   Cutoff 300 ng/mL  The urine drug screen provides only a preliminary, unconfirmed analytical test result and should not be used for non-medical purposes. Clinical consideration and professional judgment should be applied to any positive drug screen result due to possible interfering substances. A more specific alternate chemical method must be used in order to obtain a confirmed analytical result. Gas chromatography / mass spectrometry (GC/MS) is the preferred confirm atory method. Performed at [facility], [address] [place], [place] [address]   Resp Panel by RT-PCR (Flu A&B, Covid) Nasopharyngeal Swab     Status: None   Collection Time: 04/26/21  4:01 AM   Specimen: Nasopharyngeal Swab; Nasopharyngeal(NP) swabs in vial transport medium  Result Value Ref Range   SARS Coronavirus 2 by RT PCR NEGATIVE NEGATIVE    Comment: (NOTE) SARS-CoV-2 target nucleic acids are NOT DETECTED.  The SARS-CoV-2 RNA is generally detectable in upper respiratory specimens during the acute phase of infection. The lowest concentration of SARS-CoV-2 viral copies this assay can detect is 138 copies/mL. A negative result does not preclude SARS-Cov-2 infection and should not be used as the sole basis for treatment or other patient management decisions. A negative result may occur with  improper specimen collection/handling, submission of specimen other than nasopharyngeal swab, presence of viral mutation(s) within the areas targeted by this assay, and inadequate number of viral copies(<138 copies/mL). A negative result must be combined with clinical observations, patient  history, and epidemiological information. The expected result is Negative.  Fact Sheet for Patients:  BloggerCourse.com  Fact Sheet for Healthcare Providers:  SeriousBroker.it  This test is no t yet approved or cleared by the [place] FDA and  has been authorized for detection and/or diagnosis of SARS-CoV-2 by FDA under an Emergency Use Authorization (EUA). This EUA will remain  in effect (meaning this test can be used) for the duration of the COVID-19 declaration under Section 564(b)(1) of the Act, 21 U.S.C.section 360bbb-3(b)(1), unless the authorization is terminated  or revoked sooner.       Influenza A by PCR NEGATIVE NEGATIVE   Influenza B by PCR NEGATIVE NEGATIVE    Comment: (NOTE) The Xpert Xpress SARS-CoV-2/FLU/RSV plus assay is intended as an aid in the diagnosis of influenza from Nasopharyngeal swab specimens and should not be used as a sole basis for treatment. Nasal washings and aspirates are unacceptable for Xpert Xpress SARS-CoV-2/FLU/RSV testing.  Fact Sheet for Patients: BloggerCourse.com  Fact Sheet for Healthcare Providers: SeriousBroker.it  This test is not yet approved or cleared by the [place] FDA and has been authorized for detection and/or diagnosis of SARS-CoV-2 by FDA under an Emergency Use Authorization (EUA). This EUA will  remain in effect (meaning this test can be used) for the duration of the COVID-19 declaration under Section 564(b)(1) of the Act, 21 U.S.C. section 360bbb-3(b)(1), unless the authorization is terminated or revoked.  Performed at [facility], [address] [place], [place] [address]     Blood Alcohol level:  Lab Results  Component Value Date   ETH 250 (H) 04/26/2021   ETH 212 (H) 06/28/2016    Metabolic Disorder Labs:  No results found for: HGBA1C, MPG No results found for: PROLACTIN No results  found for: CHOL, TRIG, HDL, CHOLHDL, VLDL, LDLCALC  Current Medications: Current Facility-Administered Medications  Medication Dose Route Frequency Provider Last Rate Last Admin  . acetaminophen (TYLENOL) tablet 650 mg  650 mg Oral Q6H PRN [name], NP      . alum & mag hydroxide-simeth (MAALOX/MYLANTA) 200-200-20 MG/5ML suspension 30 mL  30 mL Oral Q4H PRN [name], NP      . Melene Muller ON 04/27/2021] buPROPion (WELLBUTRIN XL) 24 hr tablet 150 mg  150 mg Oral Daily [name], MD      . famotidine (PEPCID) tablet 20 mg  20 mg Oral BID [name], NP      . hydrOXYzine (ATARAX/VISTARIL) tablet 25 mg  25 mg Oral QHS PRN [name], NP      . magnesium hydroxide (MILK OF MAGNESIA) suspension 30 mL  30 mL Oral Daily PRN [name], NP      . meclizine (ANTIVERT) tablet 12.5 mg  12.5 mg Oral TID PRN [name], MD      . Melene Muller ON 04/27/2021] multivitamin with minerals tablet 1 tablet  1 tablet Oral Daily [name], NP      . Melene Muller ON 04/27/2021] naltrexone (DEPADE) tablet 25 mg  25 mg Oral Daily [name], MD      . nicotine (NICODERM CQ - dosed in mg/24 hours) patch 21 mg  21 mg Transdermal Daily [name], MD       PTA Medications: Medications Prior to Admission  Medication Sig Dispense Refill Last Dose  . famotidine (PEPCID) 20 MG tablet Take 1 tablet (20 mg total) by mouth 2 (two) times daily. 30 tablet 0     Musculoskeletal: Strength & Muscle Tone: within normal limits Gait & Station: normal Patient leans: N/A   Psychiatric Specialty Exam:  Presentation  General Appearance: Disheveled  Eye Contact:Fair  Speech:Clear and Coherent  Speech Volume:Normal  Handedness:Right   Mood and Affect  Mood:Anxious; Dysphoric  Affect:Congruent; Tearful   Thought Process  Thought Processes:Coherent  Duration of Psychotic Symptoms: No data recorded Past Diagnosis of Schizophrenia or Psychoactive disorder: No  Descriptions of  Associations:Intact  Orientation:Full (Time, Place and Person)  Thought Content:Logical  Hallucinations:Hallucinations: None  Ideas of Reference:None  Suicidal Thoughts:Suicidal Thoughts: Yes, Passive SI Passive Intent and/or Plan: Without Plan; Without Intent  Homicidal Thoughts:Homicidal Thoughts: No   Sensorium  Memory:Immediate Good; Recent Good; Remote Good  Judgment:Fair  Insight:Fair   Executive Functions  Concentration:Good  Attention Span:Good  Recall:Good  Fund of Knowledge:Good  Language:Good   Psychomotor Activity  Psychomotor Activity:Psychomotor Activity: Restlessness   Assets  Assets:Communication Skills; Desire for Improvement; Leisure Time; Physical Health; Resilience; Social Support   Sleep  Sleep:Sleep: Fair    Physical Exam: Physical Exam Vitals and nursing note reviewed.  Constitutional:      Appearance: Normal appearance.  HENT:     Head: Normocephalic and atraumatic.  Right Ear: External ear normal.     Left Ear: External ear normal.     Nose: Nose normal.     Mouth/Throat:     Mouth: Mucous membranes are moist.     Pharynx: Oropharynx is clear.  Eyes:     Extraocular Movements: Extraocular movements intact.     Conjunctiva/sclera: Conjunctivae normal.     Pupils: Pupils are equal, round, and reactive to light.  Cardiovascular:     Rate and Rhythm: Normal rate and regular rhythm.     Pulses: Normal pulses.     Heart sounds: Normal heart sounds.  Pulmonary:     Effort: Pulmonary effort is normal.     Breath sounds: Normal breath sounds.  Abdominal:     General: Abdomen is flat.     Palpations: Abdomen is soft.  Musculoskeletal:        General: No swelling. Normal range of motion.     Cervical back: Normal range of motion and neck supple.  Skin:    General: Skin is warm and dry.  Neurological:     General: No focal deficit present.     Mental Status: She is alert and oriented to person, place, and time.   Psychiatric:        Attention and Perception: Attention and perception normal.        Mood and Affect: Mood is anxious and depressed. Affect is tearful.        Speech: Speech normal.        Behavior: Behavior is cooperative.        Thought Content: Thought content includes suicidal ideation. Thought content does not include suicidal plan.        Cognition and Memory: Cognition and memory normal.        Judgment: Judgment is impulsive.    Review of Systems  Constitutional: Negative.   HENT: Negative.   Eyes: Negative.   Respiratory: Negative.   Cardiovascular: Negative.   Gastrointestinal: Negative.   Genitourinary: Negative.   Musculoskeletal: Negative.   Skin: Negative.   Neurological: Positive for dizziness. Negative for weakness.  Endo/Heme/Allergies: Positive for environmental allergies. Does not bruise/bleed easily.  Psychiatric/Behavioral: Positive for depression, substance abuse and suicidal ideas. Negative for hallucinations. The patient is nervous/anxious.    Blood pressure 130/80, pulse 79, temperature 98.9 F (37.2 C), temperature source Oral, resp. rate 18, height  (1.727 m), weight 71.7 kg, last menstrual period 04/10/2021, SpO2 98 %. Body mass index is 24.02 kg/m.  Treatment Plan Summary: Daily contact with patient to assess and evaluate symptoms and progress in treatment and Medication management  1) MDD, recurrent, severe without psychotic features - Start Wellbutrin XL 150 mg daily that patient found helpful in the past  2) Alcohol Use Disorder, moderate  - Start Naltrexone 25 mg daily and titrate to 50 mg daily as tolerated  3) Tobacco Use Disorder - Nicoderm 21 mg patch  4) Borderline Personality Disorder - Patient interested in setting up therapy prior to discharge. She has had positive experience with CBT in the past, open to telepsych appointments  Observation Level/Precautions:  15 minute checks  Laboratory:  Completed in ED  Psychotherapy:     Medications:    Consultations:    Discharge Concerns:    Estimated LOS:  Other:     Physician Treatment Plan for Primary Diagnosis: Major depressive disorder, recurrent severe without psychotic features (HCC) Long Term Goal(s): Improvement in symptoms so as ready for discharge  Short Term Goals:  Ability to identify changes in lifestyle to reduce recurrence of condition will improve, Ability to verbalize feelings will improve, Ability to disclose and discuss suicidal ideas, Ability to demonstrate self-control will improve, Ability to identify and develop effective coping behaviors will improve, Ability to maintain clinical measurements within normal limits will improve, Compliance with prescribed medications will improve and Ability to identify triggers associated with substance abuse/mental health issues will improve  Physician Treatment Plan for Secondary Diagnosis: Principal Problem:   Major depressive disorder, recurrent severe without psychotic features (HCC) Active Problems:   Borderline personality disorder (HCC)   Alcohol use disorder, moderate, dependence (HCC)   Generalized anxiety disorder  Long Term Goal(s): Improvement in symptoms so as ready for discharge  Short Term Goals: Ability to identify changes in lifestyle to reduce recurrence of condition will improve, Ability to verbalize feelings will improve, Ability to disclose and discuss suicidal ideas, Ability to demonstrate self-control will improve, Ability to identify and develop effective coping behaviors will improve, Ability to maintain clinical measurements within normal limits will improve, Compliance with prescribed medications will improve and Ability to identify triggers associated with substance abuse/mental health issues will improve  I certify that inpatient services furnished can reasonably be expected to improve the patient's condition.    [name], MD 5/13/20224:00 PM

## 2021-04-26 NOTE — ED Provider Notes (Signed)
[facility] Emergency Department Provider Note  ____________________________________________  Time seen: Approximately 12:30 AM  I have reviewed the triage vital signs and the nursing notes.   HISTORY  Chief Complaint Suicidal   HPI Christine Allison is a 40 y.o. female with a history of depression, anxiety, ADD, alcohol abuse who presents voluntarily for suicidal thoughts.  Patient reports that she has been under a lot of stress and has been feeling very depressed.  She has not been taking any medications.  She says that she was drinking today and was having suicidal thoughts.  She says she had a plan of cutting her throat so she called the crisis hotline for help.  Patient continues to endorse suicidal thoughts at this time.  She reports that she has been on over 20 different types of medications for mental health but none of them helped.  She denies any medical complaints at this time   Past Medical History:  Diagnosis Date  . ADD (attention deficit disorder)   . Anxiety   . Depression   . Ovarian cyst     Patient Active Problem List   Diagnosis Date Noted  . CIN II (cervical intraepithelial neoplasia II) 05/31/2019  . Self-inflicted laceration of wrist (HCC) 06/28/2016  . Alcohol abuse 06/28/2016  . Bipolar disorder (HCC) 06/28/2016    Past Surgical History:  Procedure Laterality Date  . APPENDECTOMY    . CERVICAL BIOPSY  W/ LOOP ELECTRODE EXCISION      Prior to Admission medications   Medication Sig Start Date End Date Taking? Authorizing Provider  famotidine (PEPCID) 20 MG tablet Take 1 tablet (20 mg total) by mouth 2 (two) times daily. 07/17/20 07/17/21  Enid Derry, PA-C  Multiple Vitamin (MULTIVITAMIN) tablet Take 1 tablet by mouth daily.    [provider]    Allergies Amoxicillin  Family History  Problem Relation Age of Onset  . Hyperlipidemia Mother   . Hypertension Mother   . Hyperlipidemia Father     Social  History Social History   Tobacco Use  . Smoking status: Current Every Day Smoker    Packs/day: 0.50    Types: Cigarettes  . Smokeless tobacco: Never Used  Vaping Use  . Vaping Use: Never used  Substance Use Topics  . Alcohol use: Yes    Alcohol/week: 35.0 standard drinks    Types: 35 Cans of beer per week    Comment: 5 beers a day  . Drug use: Yes    Types: Marijuana, Cocaine    Review of Systems  Constitutional: Negative for fever. Eyes: Negative for visual changes. ENT: Negative for sore throat. Neck: No neck pain  Cardiovascular: Negative for chest pain. Respiratory: Negative for shortness of breath. Gastrointestinal: Negative for abdominal pain, vomiting or diarrhea. Genitourinary: Negative for dysuria. Musculoskeletal: Negative for back pain. Skin: Negative for rash. Neurological: Negative for headaches, weakness or numbness. Psych: + depression and SI. No HI  ____________________________________________   PHYSICAL EXAM:  VITAL SIGNS: ED Triage Vitals  Enc Vitals Group     BP 04/26/21 0007 (!) 129/102     Pulse Rate 04/26/21 0007 100     Resp 04/26/21 0007 20     Temp 04/26/21 0004 98.8 F (37.1 C)     Temp Source 04/26/21 0004 Oral     SpO2 04/26/21 0007 100 %     Weight 04/26/21 0005 165 lb (74.8 kg)     Height 04/26/21 0005 5\' 8"  (1.727 m)  Head Circumference --      Peak Flow --      Pain Score 04/26/21 0005 0     Pain Loc --      Pain Edu? --      Excl. in GC? --     Constitutional: Alert and oriented. Well appearing and in no apparent distress. HEENT:      Head: Normocephalic and atraumatic.         Eyes: Conjunctivae are normal. Sclera is non-icteric.       Mouth/Throat: Mucous membranes are moist.       Neck: Supple with no signs of meningismus. Cardiovascular: Regular rate and rhythm.  Respiratory: Normal respiratory effort.  Gastrointestinal: Soft, non tender, and non distended. Musculoskeletal: No edema, cyanosis, or erythema of  extremities. Neurologic: Normal speech and language. Face is symmetric. Moving all extremities. No gross focal neurologic deficits are appreciated. Skin: Skin is warm, dry and intact. No rash noted. Psychiatric: Mood and affect are depressed. Speech and behavior are normal.  ____________________________________________   LABS (all labs ordered are listed, but only abnormal results are displayed)  Labs Reviewed  CBC  COMPREHENSIVE METABOLIC PANEL  ETHANOL  URINALYSIS, COMPLETE (UACMP) WITH MICROSCOPIC  URINE DRUG SCREEN, QUALITATIVE ([facility] ONLY)  POC URINE PREG, ED   ____________________________________________  EKG  none  ____________________________________________  RADIOLOGY  none  ____________________________________________   PROCEDURES  Procedure(s) performed: None Procedures Critical Care performed:  None ____________________________________________   INITIAL IMPRESSION / ASSESSMENT AND PLAN / ED COURSE   40 y.o. female with a history of depression, anxiety, ADD, alcohol abuse who presents voluntarily for suicidal thoughts with plan to cut her neck. + EtOH.  Patient continues to be actively suicidal therefore meeting criteria for IVC.  Basic blood work has been ordered psychiatry has been consulted.  Old medical records reviewed      Please note:  Patient was evaluated in Emergency Department today for the symptoms described in the history of present illness. Patient was evaluated in the context of the global COVID-19 pandemic, which necessitated consideration that the patient might be at risk for infection with the SARS-CoV-2 virus that causes COVID-19. Institutional protocols and algorithms that pertain to the evaluation of patients at risk for COVID-19 are in a state of rapid change based on information released by regulatory bodies including the CDC and federal and state organizations. These policies and algorithms were followed during the patient's care in the  ED.  Some ED evaluations and interventions may be delayed as a result of limited staffing during the pandemic.   ____________________________________________   FINAL CLINICAL IMPRESSION(S) / ED DIAGNOSES   Final diagnoses:  Suicidal ideation  Severe episode of recurrent major depressive disorder, without psychotic features (HCC)  Alcoholic intoxication with complication (HCC)      NEW MEDICATIONS STARTED DURING THIS VISIT:  ED Discharge Orders    None       Note:  This document was prepared using Dragon voice recognition software and may include unintentional dictation errors.    Don Perking, [place], MD 04/26/21 [phone]

## 2021-04-26 NOTE — ED Provider Notes (Signed)
Emergency Medicine Observation Re-evaluation Note  Christine Allison is a 40 y.o. female, seen on rounds today.  Pt initially presented to the ED for complaints of Suicidal Currently, the patient is calm, resting.  Physical Exam  BP (!) 129/102 (BP Location: Left Arm)   Pulse 100   Temp 98.4 F (36.9 C) (Oral)   Resp 20   Ht 5\' 8"  (1.727 m)   Wt 74.8 kg   LMP 04/10/2021   SpO2 100%   BMI 25.09 kg/m  Physical Exam General:NAD Cardiac:well perfused Lungs:even and unlabored Psych:calm  ED Course / MDM  EKG:   I have reviewed the labs performed to date as well as medications administered while in observation.  Recent changes in the last 24 hours include none.  Plan  Current plan is for psych dispo. Patient is under full IVC at this time.   04/12/2021, MD 04/26/21 [phone]

## 2021-04-26 NOTE — ED Notes (Signed)
Report givento Christine Allison patient to be transferred to lower level [address]

## 2021-04-26 NOTE — Plan of Care (Addendum)
Patient new to the unit today, hasn't had time to progress  Problem: Education: Goal: Knowledge of [facility] Education information/materials will improve Outcome: Not Progressing Goal: Emotional status will improve Outcome: Not Progressing Goal: Mental status will improve Outcome: Not Progressing Goal: Verbalization of understanding the information provided will improve Outcome: Not Progressing   Problem: Health Behavior/Discharge Planning: Goal: Identification of resources available to assist in meeting health care needs will improve Outcome: Not Progressing Goal: Compliance with treatment plan for underlying cause of condition will improve Outcome: Not Progressing   Problem: Education: Goal: Utilization of techniques to improve thought processes will improve Outcome: Not Progressing Goal: Knowledge of the prescribed therapeutic regimen will improve Outcome: Not Progressing   Problem: Coping: Goal: Coping ability will improve Outcome: Not Progressing Goal: Will verbalize feelings Outcome: Not Progressing   Problem: Role Relationship: Goal: Will demonstrate positive changes in social behaviors and relationships Outcome: Not Progressing   Problem: Safety: Goal: Ability to disclose and discuss suicidal ideas will improve Outcome: Not Progressing Goal: Ability to identify and utilize support systems that promote safety will improve Outcome: Not Progressing   Problem: Self-Concept: Goal: Will verbalize positive feelings about self Outcome: Not Progressing Goal: Level of anxiety will decrease Outcome: Not Progressing

## 2021-04-26 NOTE — BH Assessment (Signed)
Writer spoke with the patient to complete an updated/reassessment. Patient continues to report SI and denies HI and AV/H.

## 2021-04-26 NOTE — BH Assessment (Addendum)
[facility] BMU currently at capacity  Referral information for Psychiatric Hospitalization faxed to;    [facility] (Currently at Capacity)  . [facility] [phone]),   . [facility] [phone]),   . [facility] [phone]- [phone]),   . [facility] [phone]).

## 2021-04-26 NOTE — Progress Notes (Signed)
Patient presents with a flat and depressed affect. Patient endorses passive SI but contracts verbally for safety. Patient observed interacting appropriately with staff and peers on the unit. Patient compliant with medication administration per MD orders. Patient denies HI/AVH. Patient being monitored Q 15 minutes for safety per unit protocol. Pt remains safe on the unit.

## 2021-04-26 NOTE — ED Triage Notes (Addendum)
Pt here voluntarily for suicidal thoughts states has been drinking and had a plan to cut herself. Pt has hx of the same, positive etoh.
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
## 2021-04-26 NOTE — Tx Team (Signed)
Initial Treatment Plan 04/26/2021 5:00 PM [name] TWS:[id number]    PATIENT STRESSORS: Medication change or noncompliance Substance abuse Traumatic event   PATIENT STRENGTHS: Average or above average intelligence Communication skills Physical Health Supportive family/friends Work skills   PATIENT IDENTIFIED PROBLEMS: Overwhelmed with stress  Suicidal ideation                   DISCHARGE CRITERIA:  Ability to meet basic life and health needs Adequate post-discharge living arrangements Medical problems require only outpatient monitoring Verbal commitment to aftercare and medication compliance  PRELIMINARY DISCHARGE PLAN: Attend aftercare/continuing care group Return to previous living arrangement  PATIENT/FAMILY INVOLVEMENT: This treatment plan has been presented to and reviewed with the patient, Christine Allison, and/or family member  The patient and family have been given the opportunity to ask questions and make suggestions.  Leonarda Salon, RN 04/26/2021, 5:00 PM

## 2021-04-27 NOTE — Progress Notes (Signed)
[facility]  04/27/2021 12:01 PM Christine Allison  MRN:  [id number]   CC "Litte tired."  Subjective:  40 year old female with MDD, BPD, and AUD presenting to emergency room for suicidal ideations. No acute events overnight, medication compliant, attending to ADLs.   Patient seen one-on-one this morning. She denies any side effects to medications. She notes she feels better overall since admission. She has some passive SI, but is able to contract for safety. She denies HI/AH/VH. Time spent processing some of her recent life stressors. She also inquires about borderline personality disorder and what that means. Time spent going through the criteria, prognosis, and treatment. She notes that she feels she would benefit from DBT or partial hospitalization at discharge. She does not drive, and is open to virtual visits. No other questions or concerns today.   Principal Problem: Major depressive disorder, recurrent severe without psychotic features (HCC) Diagnosis: Principal Problem:   Major depressive disorder, recurrent severe without psychotic features (HCC) Active Problems:   Borderline personality disorder (HCC)   Alcohol use disorder, moderate, dependence (HCC)   Generalized anxiety disorder  Total Time spent with patient: 30 minutes  Past Psychiatric History: See H&P  Past Medical History:  Past Medical History:  Diagnosis Date  . ADD (attention deficit disorder)   . Anxiety   . Depression   . Ovarian cyst     Past Surgical History:  Procedure Laterality Date  . APPENDECTOMY    . CERVICAL BIOPSY  W/ LOOP ELECTRODE EXCISION     Family History:  Family History  Problem Relation Age of Onset  . Hyperlipidemia Mother   . Hypertension Mother   . Hyperlipidemia Father    Family Psychiatric  History: See H&P. She notes her mother also suffered from alcohol use disorder Social History:  Social History   Substance and Sexual Activity  Alcohol Use Yes  . Alcohol/week:  35.0 standard drinks  . Types: 35 Cans of beer per week   Comment: 5 beers a day     Social History   Substance and Sexual Activity  Drug Use Yes  . Types: Marijuana, Cocaine    Social History   Socioeconomic History  . Marital status: Single    Spouse name: Not on file  . Number of children: Not on file  . Years of education: Not on file  . Highest education level: Not on file  Occupational History  . Not on file  Tobacco Use  . Smoking status: Current Every Day Smoker    Packs/day: 0.50    Types: Cigarettes  . Smokeless tobacco: Never Used  Vaping Use  . Vaping Use: Never used  Substance and Sexual Activity  . Alcohol use: Yes    Alcohol/week: 35.0 standard drinks    Types: 35 Cans of beer per week    Comment: 5 beers a day  . Drug use: Yes    Types: Marijuana, Cocaine  . Sexual activity: Yes    Birth control/protection: None    Comment: husband had surgery  Other Topics Concern  . Not on file  Social History Narrative  . Not on file   Social Determinants of Health   Financial Resource Strain: Not on file  Food Insecurity: Not on file  Transportation Needs: Not on file  Physical Activity: Not on file  Stress: Not on file  Social Connections: Not on file   Additional Social History:  Sleep: Fair  Appetite:  Fair  Current Medications: Current Facility-Administered Medications  Medication Dose Route Frequency Provider Last Rate Last Admin  . acetaminophen (TYLENOL) tablet 650 mg  650 mg Oral Q6H PRN [name], NP      . alum & mag hydroxide-simeth (MAALOX/MYLANTA) 200-200-20 MG/5ML suspension 30 mL  30 mL Oral Q4H PRN [name], NP      . buPROPion (WELLBUTRIN XL) 24 hr tablet 150 mg  150 mg Oral Daily [name], MD   150 mg at 04/27/21 0807  . famotidine (PEPCID) tablet 20 mg  20 mg Oral BID [name], NP   20 mg at 04/27/21 7893  . hydrOXYzine (ATARAX/VISTARIL) tablet 25 mg  25 mg Oral QHS PRN  [name], NP   25 mg at 04/26/21 2124  . magnesium hydroxide (MILK OF MAGNESIA) suspension 30 mL  30 mL Oral Daily PRN [name], NP      . meclizine (ANTIVERT) tablet 12.5 mg  12.5 mg Oral TID PRN [name], MD      . multivitamin with minerals tablet 1 tablet  1 tablet Oral Daily [name], NP   1 tablet at 04/27/21 8101  . naltrexone (DEPADE) tablet 25 mg  25 mg Oral Daily [name], MD   25 mg at 04/27/21 0829  . nicotine (NICODERM CQ - dosed in mg/24 hours) patch 21 mg  21 mg Transdermal Daily [name], MD   21 mg at 04/27/21 7510  . thiamine tablet 100 mg  100 mg Oral Daily [name], MD   100 mg at 04/27/21 2585    Lab Results:  Results for orders placed or performed during the hospital encounter of 04/26/21 (from the past 48 hour(s))  CBC     Status: Abnormal   Collection Time: 04/26/21 12:30 AM  Result Value Ref Range   WBC 9.0 4.0 - 10.5 K/uL   RBC 4.42 3.87 - 5.11 MIL/uL   Hemoglobin 15.1 (H) 12.0 - 15.0 g/dL   HCT 27.7 82.4 - 23.5 %   MCV 92.3 80.0 - 100.0 fL   MCH 34.2 (H) 26.0 - 34.0 pg   MCHC 37.0 (H) 30.0 - 36.0 g/dL   RDW 36.1 44.3 - 15.4 %   Platelets 253 150 - 400 K/uL   nRBC 0.0 0.0 - 0.2 %    Comment: Performed at [facility], [address] [place], [place] [address]  Comprehensive metabolic panel     Status: Abnormal   Collection Time: 04/26/21 12:30 AM  Result Value Ref Range   Sodium 136 135 - 145 mmol/L   Potassium 3.4 (L) 3.5 - 5.1 mmol/L   Chloride 105 98 - 111 mmol/L   CO2 20 (L) 22 - 32 mmol/L   Glucose, Bld 100 (H) 70 - 99 mg/dL    Comment: Glucose reference range applies only to samples taken after fasting for at least 8 hours.   BUN 8 6 - 20 mg/dL   Creatinine, Ser 6.19 0.44 - 1.00 mg/dL   Calcium 9.5 8.9 - 50.9 mg/dL   Total Protein 7.6 6.5 - 8.1 g/dL   Albumin 4.3 3.5 - 5.0 g/dL   AST 29 15 - 41 U/L   ALT 40 0 - 44 U/L   Alkaline Phosphatase 60 38 - 126 U/L   Total Bilirubin 0.8  0.3 - 1.2 mg/dL   GFR, Estimated >32 >67 mL/min  Comment: (NOTE) Calculated using the CKD-EPI Creatinine Equation (2021)    Anion gap 11 5 - 15    Comment: Performed at [facility], [address] [place], [place] [address]  Ethanol     Status: Abnormal   Collection Time: 04/26/21 12:30 AM  Result Value Ref Range   Alcohol, Ethyl (B) 250 (H) <10 mg/dL    Comment: (NOTE) Lowest detectable limit for serum alcohol is 10 mg/dL.  For medical purposes only. Performed at [facility], [address] [place], [place] [address]   POC Urine Pregnancy, ED     Status: None   Collection Time: 04/26/21  1:15 AM  Result Value Ref Range   Preg Test, Ur NEGATIVE NEGATIVE    Comment:        THE SENSITIVITY OF THIS METHODOLOGY IS >24 mIU/mL   Urinalysis, Complete w Microscopic Urine, Clean Catch     Status: Abnormal   Collection Time: 04/26/21  1:16 AM  Result Value Ref Range   Color, Urine STRAW (A) YELLOW   APPearance CLEAR (A) CLEAR   Specific Gravity, Urine 1.001 (L) 1.005 - 1.030   pH 6.0 5.0 - 8.0   Glucose, UA NEGATIVE NEGATIVE mg/dL   Hgb urine dipstick NEGATIVE NEGATIVE   Bilirubin Urine NEGATIVE NEGATIVE   Ketones, ur NEGATIVE NEGATIVE mg/dL   Protein, ur NEGATIVE NEGATIVE mg/dL   Nitrite NEGATIVE NEGATIVE   Leukocytes,Ua NEGATIVE NEGATIVE   RBC / HPF 0-5 0 - 5 RBC/hpf   WBC, UA 0-5 0 - 5 WBC/hpf   Bacteria, UA RARE (A) NONE SEEN   Squamous Epithelial / LPF 0-5 0 - 5    Comment: Performed at [facility], [address], [place], [place] [address]  Urine Drug Screen, Qualitative ([facility] only)     Status: None   Collection Time: 04/26/21  1:16 AM  Result Value Ref Range   Tricyclic, Ur Screen NONE DETECTED NONE DETECTED   Amphetamines, Ur Screen NONE DETECTED NONE DETECTED   MDMA (Ecstasy)Ur Screen NONE DETECTED NONE DETECTED   Cocaine Metabolite,Ur [place] NONE DETECTED NONE DETECTED   Opiate, Ur Screen NONE DETECTED NONE DETECTED    Phencyclidine (PCP) Ur S NONE DETECTED NONE DETECTED   Cannabinoid 50 Ng, Ur  NONE DETECTED NONE DETECTED   Barbiturates, Ur Screen NONE DETECTED NONE DETECTED   Benzodiazepine, Ur Scrn NONE DETECTED NONE DETECTED   Methadone Scn, Ur NONE DETECTED NONE DETECTED    Comment: (NOTE) Tricyclics + metabolites, urine    Cutoff 1000 ng/mL Amphetamines + metabolites, urine  Cutoff 1000 ng/mL MDMA (Ecstasy), urine              Cutoff 500 ng/mL Cocaine Metabolite, urine          Cutoff 300 ng/mL Opiate + metabolites, urine        Cutoff 300 ng/mL Phencyclidine (PCP), urine         Cutoff 25 ng/mL Cannabinoid, urine                 Cutoff 50 ng/mL Barbiturates + metabolites, urine  Cutoff 200 ng/mL Benzodiazepine, urine              Cutoff 200 ng/mL Methadone, urine                   Cutoff 300 ng/mL  The urine drug screen provides only a preliminary, unconfirmed analytical test result and should not be used for non-medical purposes. Clinical consideration and professional  judgment should be applied to any positive drug screen result due to possible interfering substances. A more specific alternate chemical method must be used in order to obtain a confirmed analytical result. Gas chromatography / mass spectrometry (GC/MS) is the preferred confirm atory method. Performed at [facility], [address] [place], [place] [address]   Resp Panel by RT-PCR (Flu A&B, Covid) Nasopharyngeal Swab     Status: None   Collection Time: 04/26/21  4:01 AM   Specimen: Nasopharyngeal Swab; Nasopharyngeal(NP) swabs in vial transport medium  Result Value Ref Range   SARS Coronavirus 2 by RT PCR NEGATIVE NEGATIVE    Comment: (NOTE) SARS-CoV-2 target nucleic acids are NOT DETECTED.  The SARS-CoV-2 RNA is generally detectable in upper respiratory specimens during the acute phase of infection. The lowest concentration of SARS-CoV-2 viral copies this assay can detect is 138 copies/mL. A negative  result does not preclude SARS-Cov-2 infection and should not be used as the sole basis for treatment or other patient management decisions. A negative result may occur with  improper specimen collection/handling, submission of specimen other than nasopharyngeal swab, presence of viral mutation(s) within the areas targeted by this assay, and inadequate number of viral copies(<138 copies/mL). A negative result must be combined with clinical observations, patient history, and epidemiological information. The expected result is Negative.  Fact Sheet for Patients:  BloggerCourse.com  Fact Sheet for Healthcare Providers:  SeriousBroker.it  This test is no t yet approved or cleared by the [place] FDA and  has been authorized for detection and/or diagnosis of SARS-CoV-2 by FDA under an Emergency Use Authorization (EUA). This EUA will remain  in effect (meaning this test can be used) for the duration of the COVID-19 declaration under Section 564(b)(1) of the Act, 21 U.S.C.section 360bbb-3(b)(1), unless the authorization is terminated  or revoked sooner.       Influenza A by PCR NEGATIVE NEGATIVE   Influenza B by PCR NEGATIVE NEGATIVE    Comment: (NOTE) The Xpert Xpress SARS-CoV-2/FLU/RSV plus assay is intended as an aid in the diagnosis of influenza from Nasopharyngeal swab specimens and should not be used as a sole basis for treatment. Nasal washings and aspirates are unacceptable for Xpert Xpress SARS-CoV-2/FLU/RSV testing.  Fact Sheet for Patients: BloggerCourse.com  Fact Sheet for Healthcare Providers: SeriousBroker.it  This test is not yet approved or cleared by the [place] FDA and has been authorized for detection and/or diagnosis of SARS-CoV-2 by FDA under an Emergency Use Authorization (EUA). This EUA will remain in effect (meaning this test can be used) for the  duration of the COVID-19 declaration under Section 564(b)(1) of the Act, 21 U.S.C. section 360bbb-3(b)(1), unless the authorization is terminated or revoked.  Performed at [facility], [address] [place], [place] [address]     Blood Alcohol level:  Lab Results  Component Value Date   ETH 250 (H) 04/26/2021   ETH 212 (H) 06/28/2016    Metabolic Disorder Labs: No results found for: HGBA1C, MPG No results found for: PROLACTIN No results found for: CHOL, TRIG, HDL, CHOLHDL, VLDL, LDLCALC  Physical Findings: AIMS:  , ,  ,  ,    CIWA:    COWS:     Musculoskeletal: Strength & Muscle Tone: within normal limits Gait & Station: normal Patient leans: N/A  Psychiatric Specialty Exam:  Presentation  General Appearance: Disheveled  Eye Contact:Good  Speech:Clear and Coherent  Speech Volume:Normal  Handedness:Right   Mood and Affect  Mood:Anxious; Depressed  Affect:Congruent   Thought Process  Thought Processes:Coherent  Descriptions of Associations:Intact  Orientation:Full (Time, Place and Person)  Thought Content:Logical  History of Schizophrenia/Schizoaffective disorder:No  Duration of Psychotic Symptoms:No data recorded Hallucinations:Hallucinations: None  Ideas of Reference:None  Suicidal Thoughts:Suicidal Thoughts: Yes, Passive SI Passive Intent and/or Plan: Without Intent; Without Plan  Homicidal Thoughts:Homicidal Thoughts: No   Sensorium  Memory:Immediate Good; Recent Good; Remote Good  Judgment:Fair  Insight:Fair   Executive Functions  Concentration:Good  Attention Span:Good  Recall:Good  Fund of Knowledge:Good  Language:Good   Psychomotor Activity  Psychomotor Activity:Psychomotor Activity: Normal   Assets  Assets:Communication Skills; Desire for Improvement; Housing; Intimacy; Leisure Time; Physical Health; Resilience; Social Support; Talents/Skills   Sleep  Sleep:Sleep: Fair Number of Hours of Sleep:  7.5    Physical Exam: Physical Exam ROS Blood pressure 124/83, pulse 62, temperature 98.5 F (36.9 C), temperature source Oral, resp. rate 18, height 5\' 8"  (1.727 m), weight 71.7 kg, last menstrual period 04/10/2021, SpO2 100 %. Body mass index is 24.02 kg/m.   Treatment Plan Summary: Daily contact with patient to assess and evaluate symptoms and progress in treatment and Medication management 1) MDD, recurrent, severe without psychotic features - Continue Wellbutrin XL 150 mg daily that patient found helpful in the past  2) Alcohol Use Disorder, moderate  - Continue Naltrexone 25 mg daily and titrate to 50 mg daily as tolerated  3) Tobacco Use Disorder - Nicoderm 21 mg patch  4) Borderline Personality Disorder - Patient interested in setting up therapy prior to discharge. She has had positive experience with DBT in the past, open to telepsych appointments  04/12/2021, MD 04/27/2021, 12:01 PM

## 2021-04-27 NOTE — Progress Notes (Signed)
Patient presents with a flat affect but brightens and smiles on approach. She is alert and oriented x4. Speech is logical/coherent. She is observed to be interacting appropriately with staff. Patient denies SI, HI, and AVH. She rates her depression as 5/10 today. Patient reports poor sleep last night, but says that is her usual. She states she usually cannot take anything stronger than Vistaril for sleep. Patient reports her goal today is to "work on CBT method". She states that she used to attend [facility] but felt that group therapy was not as effective for her. She hopes to find outpatient therapy resources and feels than something 1:1, potentially virtual, would work better for her.   Patient was educated on medications and remains medication compliant. Patient remains safe on the unit at this time.

## 2021-04-27 NOTE — Progress Notes (Signed)
[facility] LCSW Group Therapy Note  Date/Time:  04/27/2021 1:09-2:00PM   Type of Therapy and Topic:  Group Therapy:  Healthy and Unhealthy Supports  Participation Level:  Active   Description of Group:  Patients in this group were introduced to the idea of adding a variety of healthy supports to address the various needs in their lives.Patients discussed what additional healthy supports could be helpful in their recovery and wellness after discharge in order to prevent future hospitalizations.   An emphasis was placed on using counselor, doctor, therapy groups, 12-step groups, and problem-specific support groups to expand supports.  They also worked as a group on developing a specific plan for several patients to deal with unhealthy supports through boundary-setting, psychoeducation with loved ones, and even termination of relationships.   Therapeutic Goals:   1)  discuss importance of adding supports to stay well once out of the hospital  2)  compare healthy versus unhealthy supports and identify some examples of each  3)  generate ideas and descriptions of healthy supports that can be added  4)  offer mutual support about how to address unhealthy supports  5)  encourage active participation in and adherence to discharge plan    Summary of Patient Progress:  Patient spoke about her husband and friends being a healthy support. Patient could not identify any unhealthy supports in her life. Patient would like to add therapy when she leaves the hospital.    Therapeutic Modalities:   Motivational Interviewing Brief Solution-Focused Therapy  Susa Simmonds, LCSWA 04/27/2021  2:18 PM
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
## 2021-04-27 NOTE — Progress Notes (Signed)
Patient stated during evening med pass that she was doing ok and was ready for her Pepcid, because she was experiencing some indigestion. Patient had no other complaints or concerns to voice to this Clinical research associate.

## 2021-04-27 NOTE — Progress Notes (Signed)
Patient appears pleasant and is cooperative. She ate her snack and received Vistaril to sleep. Patient denies SI/HI/AVH.

## 2021-04-27 NOTE — Progress Notes (Signed)
This writer assumed care of patient at 1300. Patient was sleeping, however, she did get up for social work group. Patient has no complaints or concerns at this time. Patient remains safe on the unit and will continue to be monitored for safety.

## 2021-04-27 NOTE — [facility] Suicide Risk Assessment (Signed)
[facility] INPATIENT:  Family/Significant Other Suicide Prevention Education  Suicide Prevention Education:  Patient Refusal for Family/Significant Other Suicide Prevention Education: The patient Christine Allison has refused to provide written consent for family/significant other to be provided Family/Significant Other Suicide Prevention Education during admission and/or prior to discharge.  Physician notified.  CSW provided patient with suicide prevention information pamphlet.   Carollee Herter  Willisha Sligar 04/27/2021, 11:52 AM

## 2021-04-28 MED ORDER — NALTREXONE HCL 50 MG PO TABS
50.0000 mg | ORAL_TABLET | Freq: Every day | ORAL | Status: DC
  Administered 2021-04-29: 50 mg via ORAL
  Filled 2021-04-28: qty 1

## 2021-04-28 NOTE — Progress Notes (Signed)
   LCSW Group Therapy Note  04/28/2021: 1:08-1:45 PM    Type of Therapy and Topic:  Group Therapy:  Overcoming Obstacles     Participation Level:  Active     Description of Group:     In this group patients will be encouraged to explore what they see as obstacles to their own wellness and recovery. They will be guided to discuss their thoughts, feelings, and behaviors related to these obstacles. The group will process together ways to cope with barriers, with attention given to specific choices patients can make. Each patient will be challenged to identify changes they are motivated to make in order to overcome their obstacles. This group will be process-oriented, with patients participating in exploration of their own experiences as well as giving and receiving support and challenge from other group members.     Therapeutic Goals:  1.    Patient will identify personal and current obstacles as they relate to admission.  2.    Patient will identify barriers that currently interfere with their wellness or overcoming obstacles.  3.    Patient will identify feelings, thought process and behaviors related to these barriers.  4.    Patient will identify two changes they are willing to make to overcome these obstacles:        Summary of Patient Progress: Patient spoke about cleaning out her mothers home and going back to therapy. Patient stated that she wants to overcome those fears and get back on track. Patient stated she would like to stay consistent.          Therapeutic Modalities:    Cognitive Behavioral Therapy  Solution Focused Therapy  Motivational Interviewing  Relapse Prevention Therapy    Susa Simmonds, LCSWA   04/28/2021

## 2021-04-28 NOTE — Progress Notes (Addendum)
Patient is calm and cooperative. She denies SI, HI, AVH. She said she had a BM today. She has no complaints at this time. Patient is observed eating snacks on the unit and reading books in her room. Patient remains safe on the unit at this time.

## 2021-04-28 NOTE — Progress Notes (Signed)
[facility] MD Progress Note  04/28/2021 12:24 PM Christine Allison  MRN:  [id number]   CC "Feeling better."  Subjective:  40 year old female with MDD, BPD, and AUD presenting to emergency room for suicidal ideations. No acute events overnight, medication compliant, attending to ADLs.   Patient seen one-on-one this morning. She notes she is starting to feel better. She denies any SI/HI/AH/VH. Denies medication side effects. No current complaints. Feels she may be ready for discharge home tmw as long as outpatient follow-up is set up. Desires therapy or partial hospitalization. Prefers virtual visits as she does not drive.    Principal Problem: Major depressive disorder, recurrent severe without psychotic features (HCC) Diagnosis: Principal Problem:   Major depressive disorder, recurrent severe without psychotic features (HCC) Active Problems:   Borderline personality disorder (HCC)   Alcohol use disorder, moderate, dependence (HCC)   Generalized anxiety disorder  Total Time spent with patient: 30 minutes  Past Psychiatric History: See H&P  Past Medical History:  Past Medical History:  Diagnosis Date  . ADD (attention deficit disorder)   . Anxiety   . Depression   . Ovarian cyst     Past Surgical History:  Procedure Laterality Date  . APPENDECTOMY    . CERVICAL BIOPSY  W/ LOOP ELECTRODE EXCISION     Family History:  Family History  Problem Relation Age of Onset  . Hyperlipidemia Mother   . Hypertension Mother   . Hyperlipidemia Father    Family Psychiatric  History: See H&P. She notes her mother also suffered from alcohol use disorder Social History:  Social History   Substance and Sexual Activity  Alcohol Use Yes  . Alcohol/week: 35.0 standard drinks  . Types: 35 Cans of beer per week   Comment: 5 beers a day     Social History   Substance and Sexual Activity  Drug Use Yes  . Types: Marijuana, Cocaine    Social History   Socioeconomic History  . Marital status:  Single    Spouse name: Not on file  . Number of children: Not on file  . Years of education: Not on file  . Highest education level: Not on file  Occupational History  . Not on file  Tobacco Use  . Smoking status: Current Every Day Smoker    Packs/day: 0.50    Types: Cigarettes  . Smokeless tobacco: Never Used  Vaping Use  . Vaping Use: Never used  Substance and Sexual Activity  . Alcohol use: Yes    Alcohol/week: 35.0 standard drinks    Types: 35 Cans of beer per week    Comment: 5 beers a day  . Drug use: Yes    Types: Marijuana, Cocaine  . Sexual activity: Yes    Birth control/protection: None    Comment: husband had surgery  Other Topics Concern  . Not on file  Social History Narrative  . Not on file   Social Determinants of Health   Financial Resource Strain: Not on file  Food Insecurity: Not on file  Transportation Needs: Not on file  Physical Activity: Not on file  Stress: Not on file  Social Connections: Not on file   Additional Social History:                         Sleep: Fair  Appetite:  Fair  Current Medications: Current Facility-Administered Medications  Medication Dose Route Frequency Provider Last Rate Last Admin  . acetaminophen (TYLENOL) tablet 650 mg  650 mg Oral Q6H PRN [name], NP      . alum & mag hydroxide-simeth (MAALOX/MYLANTA) 200-200-20 MG/5ML suspension 30 mL  30 mL Oral Q4H PRN [name], NP      . buPROPion (WELLBUTRIN XL) 24 hr tablet 150 mg  150 mg Oral Daily [name], MD   150 mg at 04/28/21 0809  . famotidine (PEPCID) tablet 20 mg  20 mg Oral BID [name], NP   20 mg at 04/28/21 0809  . hydrOXYzine (ATARAX/VISTARIL) tablet 25 mg  25 mg Oral QHS PRN [name], NP   25 mg at 04/27/21 2135  . magnesium hydroxide (MILK OF MAGNESIA) suspension 30 mL  30 mL Oral Daily PRN [name], NP   30 mL at 04/28/21 0815  . meclizine (ANTIVERT) tablet 12.5 mg  12.5 mg Oral TID PRN [name], MD      . multivitamin with minerals tablet 1 tablet  1 tablet Oral Daily [name], NP   1 tablet at 04/28/21 0809  . naltrexone (DEPADE) tablet 25 mg  25 mg Oral Daily [name], MD   25 mg at 04/28/21 0809  . nicotine (NICODERM CQ - dosed in mg/24 hours) patch 21 mg  21 mg Transdermal Daily [name], MD   21 mg at 04/28/21 3086  . thiamine tablet 100 mg  100 mg Oral Daily [name], MD   100 mg at 04/28/21 5784    Lab Results:  No results found for this or any previous visit (from the past 48 hour(s)).  Blood Alcohol level:  Lab Results  Component Value Date   ETH 250 (H) 04/26/2021   ETH 212 (H) 06/28/2016    Metabolic Disorder Labs: No results found for: HGBA1C, MPG No results found for: PROLACTIN No results found for: CHOL, TRIG, HDL, CHOLHDL, VLDL, LDLCALC  Physical Findings: AIMS:  , ,  ,  ,    CIWA:    COWS:     Musculoskeletal: Strength & Muscle Tone: within normal limits Gait & Station: normal Patient leans: N/A  Psychiatric Specialty Exam:  Presentation  General Appearance: Casual Eye Contact:Good  Speech:Clear and Coherent  Speech Volume:Normal  Handedness:Right   Mood and Affect  Mood:Euthymic Affect:Congruent   Thought Process  Thought Processes:Coherent  Descriptions of Associations:Intact  Orientation:Full (Time, Place and Person)  Thought Content:Logical  History of Schizophrenia/Schizoaffective disorder:No  Duration of Psychotic Symptoms:No data recorded Hallucinations:Hallucinations: None  Ideas of Reference:None  Suicidal Thoughts:Denies Homicidal Thoughts:Homicidal Thoughts: No   Sensorium  Memory:Immediate Good; Recent Good; Remote Good  Judgment:Fair  Insight:Fair   Executive Functions  Concentration:Good  Attention Span:Good  Recall:Good  Fund of Knowledge:Good  Language:Good   Psychomotor Activity  Psychomotor Activity:Psychomotor Activity: Normal   Assets   Assets:Communication Skills; Desire for Improvement; Housing; Intimacy; Leisure Time; Physical Health; Resilience; Social Support; Talents/Skills   Sleep  Sleep:Sleep: Good Number of Hours of Sleep: 8    Physical Exam: Physical Exam  ROS  Blood pressure (!) 113/99, pulse 69, temperature 98.7 F (37.1 C), temperature source Oral, resp. rate 18, height 5\' 8"  (1.727 m), weight 71.7 kg, last menstrual period 04/10/2021, SpO2 99 %. Body mass index is 24.02 kg/m.   Treatment Plan Summary: Daily contact with patient to assess and evaluate symptoms and progress in treatment and Medication management 1) MDD, recurrent, severe without psychotic features - Continue Wellbutrin XL 150 mg daily that patient found helpful  in the past  2) Alcohol Use Disorder, moderate  - Increase Naltrexon 50 mg daily  3) Tobacco Use Disorder - Nicoderm 21 mg patch  4) Borderline Personality Disorder - Patient interested in setting up therapy prior to discharge. She has had positive experience with DBT in the past, open to telepsych appointments   04/28/21: Psychiatric exam above reviewed and remains accurate. Assessment and plan above reviewed and updated.   [name], MD 04/28/2021, 12:24 PM

## 2021-04-28 NOTE — Progress Notes (Signed)
Patient is calm and cooperative with assessment. Patient denies SI, HI, AVH, and pain. She rates her depression as a 3/10 and anxiety as a 0/10. She states she is feeling much better today and was able to sleep better than the previous night. She states she has not had a bowel movement since Friday, patient was given Milk of Magnesia. Patient is observed to be interacting appropriately with staff and peers on the unit. Patient remains safe on the unit at this time.

## 2021-04-29 ENCOUNTER — Other Ambulatory Visit: Payer: Self-pay

## 2021-04-29 MED ORDER — BUPROPION HCL ER (XL) 150 MG PO TB24
150.0000 mg | ORAL_TABLET | Freq: Every day | ORAL | 0 refills | Status: AC
  Filled 2021-04-29: qty 7, 7d supply, fill #0

## 2021-04-29 MED ORDER — HYDROXYZINE HCL 25 MG PO TABS
25.0000 mg | ORAL_TABLET | Freq: Every evening | ORAL | 0 refills | Status: AC | PRN
  Filled 2021-04-29: qty 7, 7d supply, fill #0

## 2021-04-29 MED ORDER — NALTREXONE HCL 50 MG PO TABS
50.0000 mg | ORAL_TABLET | Freq: Every day | ORAL | 0 refills | Status: AC
  Filled 2021-04-29: qty 7, 7d supply, fill #0

## 2021-04-29 MED ORDER — NALTREXONE HCL 50 MG PO TABS: 50.0000 mg | ORAL_TABLET | Freq: Every day | ORAL | 1 refills | Status: DC

## 2021-04-29 MED ORDER — BUPROPION HCL ER (XL) 150 MG PO TB24: 150.0000 mg | ORAL_TABLET | Freq: Every day | ORAL | 1 refills | Status: DC

## 2021-04-29 MED ORDER — HYDROXYZINE HCL 25 MG PO TABS: 25.0000 mg | ORAL_TABLET | Freq: Every evening | ORAL | 1 refills | Status: DC | PRN

## 2021-04-29 NOTE — Progress Notes (Signed)
Recreation Therapy Notes  Date: 04/29/2021  Time: 9:30 am   Location: Craft room  Behavioral response: N/A   Intervention Topic: Time Management    Discussion/Intervention: Patient did not attend group.   Clinical Observations/Feedback:  Patient did not attend group.   Christine Allison LRT/CTRS        Shruthi Northrup 04/29/2021 11:38 AM

## 2021-04-29 NOTE — Progress Notes (Signed)
  [facility] Adult Case Management Discharge Plan :  Will you be returning to the same living situation after discharge:  Yes,  Patient to return to place of residence.  At discharge, do you have transportation home?: Yes,  Patient's partner to assist with transportation.  Do you have the ability to pay for your medications: No.  Release of information consent forms completed and in the chart;  Patient's signature needed at discharge.  Patient to Follow up at:  Follow-up Information    [facility] Go on 05/01/2021.   Why: Please present for scheduled IN PERSON appointment on Wednessday 01 May 2021 @ 2:30 PM.  Contact information: [address] [place] [address] [phone]               Next level of care provider has access to [facility] Link:no  Safety Planning and Suicide Prevention discussed: Yes,  SPE completed with patient, declined consent to reach collateral.  Have you used any form of tobacco in the last 30 days? (Cigarettes, Smokeless Tobacco, Cigars, and/or Pipes): Yes  Has patient been referred to the Quitline?: Patient refused referral  Patient has been referred for addiction treatment: N/A  Corky Crafts, LCSWA 04/29/2021, 12:30 PM

## 2021-04-29 NOTE — [facility] Suicide Risk Assessment (Signed)
[facility] Discharge Suicide Risk Assessment   Principal Problem: Major depressive disorder, recurrent severe without psychotic features (HCC) Discharge Diagnoses: Principal Problem:   Major depressive disorder, recurrent severe without psychotic features (HCC) Active Problems:   Borderline personality disorder (HCC)   Alcohol use disorder, moderate, dependence (HCC)   Generalized anxiety disorder   Total Time spent with patient: 35 minutes- 25 minutes face-to-face contact with patient, 10 minutes documentation, coordination of care, scripts   Musculoskeletal: Strength & Muscle Tone: within normal limits Gait & Station: normal Patient leans: N/A  Psychiatric Specialty Exam  Presentation  General Appearance: Fairly Groomed; Casual  Eye Contact:Good  Speech:Clear and Coherent  Speech Volume:Normal  Handedness:Right   Mood and Affect  Mood:Euthymic  Duration of Depression Symptoms: Greater than two weeks  Affect:Congruent   Thought Process  Thought Processes:Coherent  Descriptions of Associations:Intact  Orientation:Full (Time, Place and Person)  Thought Content:Logical  History of Schizophrenia/Schizoaffective disorder:No  Duration of Psychotic Symptoms:No data recorded Hallucinations:Hallucinations: None  Ideas of Reference:None  Suicidal Thoughts:Suicidal Thoughts: No  Homicidal Thoughts:Homicidal Thoughts: No   Sensorium  Memory:Immediate Good; Recent Good; Remote Good  Judgment:Fair  Insight:Good   Executive Functions  Concentration:Good  Attention Span:Good  Recall:Good  Fund of Knowledge:Good  Language:Good   Psychomotor Activity  Psychomotor Activity:Psychomotor Activity: Normal   Assets  Assets:Communication Skills; Desire for Improvement; Housing; Intimacy; Leisure Time; Physical Health; Resilience; Social Support; Talents/Skills   Sleep  Sleep:Sleep: Fair Number of Hours of Sleep: 7.5   Physical Exam: Physical  Exam ROS Blood pressure 113/76, pulse 71, temperature 98.5 F (36.9 C), temperature source Oral, resp. rate 18, height 5\' 8"  (1.727 m), weight 71.7 kg, last menstrual period 04/10/2021, SpO2 98 %. Body mass index is 24.02 kg/m.  Mental Status Per Nursing Assessment::   On Admission:  NA  Demographic Factors:  Caucasian and Unemployed  Loss Factors: Decrease in vocational status  Historical Factors: Prior suicide attempts and Impulsivity  Risk Reduction Factors:   Sense of responsibility to family, Living with another person, especially a relative, Positive social support, Positive therapeutic relationship and Positive coping skills or problem solving skills  Continued Clinical Symptoms:  Depression:   Recent sense of peace/wellbeing Alcohol/Substance Abuse/Dependencies Personality Disorders:   Cluster B Previous Psychiatric Diagnoses and Treatments  Cognitive Features That Contribute To Risk:  None    Suicide Risk:  Mild:  Suicidal ideation of limited frequency, intensity, duration, and specificity.  There are no identifiable plans, no associated intent, mild dysphoria and related symptoms, good self-control (both objective and subjective assessment), few other risk factors, and identifiable protective factors, including available and accessible social support.    Plan Of Care/Follow-up recommendations:  Activity:  as tolerated Diet:  regular diet  [phone], MD 04/29/2021, 10:05 AM

## 2021-04-29 NOTE — Progress Notes (Deleted)
  [facility] Adult Case Management Discharge Plan :  Will you be returning to the same living situation after discharge:  Yes,  Patient to return to place of residence.  At discharge, do you have transportation home?: Yes,  Partner to assist with transportation.  Do you have the ability to pay for your medications: No.  Release of information consent forms completed and in the chart;  Patient's signature needed at discharge.  Patient to Follow up at:  Follow-up Information    [facility] Follow up.   Why: Follow up if needed.  Contact information: [address] [place] [address] [phone]        [facility] Follow up.   Specialty: Behavioral Health Why: A clinicial from the program will reach out to you to discuss the program.  Contact information: [address] [place] [address] [phone]              Next level of care provider has access to [facility] Link:yes  Safety Planning and Suicide Prevention discussed: Yes,  SPE completed with patient, declined consent for collateral.   Have you used any form of tobacco in the last 30 days? (Cigarettes, Smokeless Tobacco, Cigars, and/or Pipes): Yes  Has patient been referred to the Quitline?: Patient refused referral  Patient has been referred for addiction treatment: N/A  Corky Crafts, LCSWA 04/29/2021, 11:14 AM

## 2021-04-29 NOTE — Progress Notes (Signed)
Patient denies SI/HI, denies A/V hallucinations. Patient verbalizes understanding of discharge instructions, follow up care and prescriptions. Patient given all belongings from [facility] locker. Patient escorted out by staff, transported by family. 

## 2021-04-29 NOTE — Discharge Summary (Signed)
Physician Discharge Summary Note  Patient:  Christine Allison is an 40 y.o., female MRN:  [id number] DOB:  12-13-1981 Patient phone:  [phone] (home)  Patient address:   [address] [place] [address],  Total Time spent with patient: 35 minutes- 25 minutes face-to-face contact with patient, 10 minutes documentation, coordination of care, scripts   Date of Admission:  04/26/2021 Date of Discharge: 04/29/2021  Reason for Admission: Suicidal ideations and worsening depression  Principal Problem: Major depressive disorder, recurrent severe without psychotic features [facility]) Discharge Diagnoses: Principal Problem:   Major depressive disorder, recurrent severe without psychotic features (HCC) Active Problems:   Borderline personality disorder (HCC)   Alcohol use disorder, moderate, dependence (HCC)   Generalized anxiety disorder   Past Psychiatric History: Patient previously seen by [facility] and [facility] for outpatient services. Not seeing anyone currently, and was not on any medications prior to admission. She has tried numerous SSRIs and SNRI's in the past. She felt Wellbutrin was the most helpful past medication. She has done therapy in the past, and found CBT to be helpful. She has two prior suicide attempts via overdose and via driving car into embankment. No history of violence  Past Medical History:  Past Medical History:  Diagnosis Date  . ADD (attention deficit disorder)   . Anxiety   . Depression   . Ovarian cyst     Past Surgical History:  Procedure Laterality Date  . APPENDECTOMY    . CERVICAL BIOPSY  W/ LOOP ELECTRODE EXCISION     Family History:  Family History  Problem Relation Age of Onset  . Hyperlipidemia Mother   . Hypertension Mother   . Hyperlipidemia Father    Family Psychiatric  History: Mother and father with alcohol use disorder Social History:  Social History   Substance and Sexual Activity  Alcohol Use Yes  . Alcohol/week: 35.0 standard  drinks  . Types: 35 Cans of beer per week   Comment: 5 beers a day     Social History   Substance and Sexual Activity  Drug Use Yes  . Types: Marijuana, Cocaine    Social History   Socioeconomic History  . Marital status: Single    Spouse name: Not on file  . Number of children: Not on file  . Years of education: Not on file  . Highest education level: Not on file  Occupational History  . Not on file  Tobacco Use  . Smoking status: Current Every Day Smoker    Packs/day: 0.50    Types: Cigarettes  . Smokeless tobacco: Never Used  Vaping Use  . Vaping Use: Never used  Substance and Sexual Activity  . Alcohol use: Yes    Alcohol/week: 35.0 standard drinks    Types: 35 Cans of beer per week    Comment: 5 beers a day  . Drug use: Yes    Types: Marijuana, Cocaine  . Sexual activity: Yes    Birth control/protection: None    Comment: husband had surgery  Other Topics Concern  . Not on file  Social History Narrative  . Not on file   Social Determinants of Health   Financial Resource Strain: Not on file  Food Insecurity: Not on file  Transportation Needs: Not on file  Physical Activity: Not on file  Stress: Not on file  Social Connections: Not on file    Hospital Course:  40 year old female with MDD, BPD, and AUD presenting to emergency room for suicidal ideations in context of  numerous life stressors. Patient was pleasant and enganged in her treatment during hospital stay. She attended all groups, and found these helpful. She was started on Wellbutrin XL 150 mg tablets daily for mood, and naltrexone 50 mg daily for alcohol use disorder. She was continued on vistaril PRN for anxiety and sleep. Mood improved during her stay, and she no longer had suicidal ideations. She also denies visual hallucinations, auditory hallucinations, and homicidal ideations. She wishes to pursue continued outpatient treatment and therapy. Patient and treatment team felt she was safe to discharge  with outpatient follow-up.   Physical Findings: AIMS:  , ,  ,  ,    CIWA:    COWS:     Musculoskeletal: Strength & Muscle Tone: within normal limits Gait & Station: normal Patient leans: N/A                Psychiatric Specialty Exam:  Presentation  General Appearance: Fairly Groomed; Casual  Eye Contact:Good  Speech:Clear and Coherent  Speech Volume:Normal  Handedness:Right   Mood and Affect  Mood:Euthymic  Affect:Congruent   Thought Process  Thought Processes:Coherent  Descriptions of Associations:Intact  Orientation:Full (Time, Place and Person)  Thought Content:Logical  History of Schizophrenia/Schizoaffective disorder:No  Duration of Psychotic Symptoms:No data recorded Hallucinations:Hallucinations: None  Ideas of Reference:None  Suicidal Thoughts:Suicidal Thoughts: No  Homicidal Thoughts:Homicidal Thoughts: No   Sensorium  Memory:Immediate Good; Recent Good; Remote Good  Judgment:Fair  Insight:Good   Executive Functions  Concentration:Good  Attention Span:Good  Recall:Good  Fund of Knowledge:Good  Language:Good   Psychomotor Activity  Psychomotor Activity:Psychomotor Activity: Normal   Assets  Assets:Communication Skills; Desire for Improvement; Housing; Intimacy; Leisure Time; Physical Health; Resilience; Social Support; Talents/Skills   Sleep  Sleep:Sleep: Fair Number of Hours of Sleep: 7.5    Physical Exam: Physical Exam Vitals and nursing note reviewed.  Constitutional:      Appearance: Normal appearance.  HENT:     Head: Normocephalic and atraumatic.     Right Ear: External ear normal.     Left Ear: External ear normal.     Nose: Nose normal.     Mouth/Throat:     Mouth: Mucous membranes are moist.     Pharynx: Oropharynx is clear.  Eyes:     Extraocular Movements: Extraocular movements intact.     Conjunctiva/sclera: Conjunctivae normal.     Pupils: Pupils are equal, round, and reactive to  light.  Cardiovascular:     Rate and Rhythm: Normal rate.     Pulses: Normal pulses.  Pulmonary:     Effort: Pulmonary effort is normal.     Breath sounds: Normal breath sounds.  Abdominal:     General: Abdomen is flat.     Palpations: Abdomen is soft.  Musculoskeletal:        General: No swelling. Normal range of motion.     Cervical back: Normal range of motion and neck supple.  Skin:    General: Skin is warm and dry.  Neurological:     General: No focal deficit present.     Mental Status: She is alert and oriented to person, place, and time.  Psychiatric:        Mood and Affect: Mood normal.        Behavior: Behavior normal.        Thought Content: Thought content normal.        Judgment: Judgment normal.    Review of Systems  Constitutional: Negative.   HENT: Negative.   Eyes: Negative.  Respiratory: Negative.   Cardiovascular: Negative.   Gastrointestinal: Negative.   Genitourinary: Negative.   Musculoskeletal: Negative.   Skin: Negative.   Neurological: Negative for weakness.       Patient reports a sensation similar to sea sickness that has been an ongoing problem prior to admission. Sensation has not become worse with medications.   Endo/Heme/Allergies: Positive for environmental allergies. Does not bruise/bleed easily.  Psychiatric/Behavioral: Negative for depression, hallucinations and suicidal ideas.   Blood pressure 113/76, pulse 71, temperature 98.5 F (36.9 C), temperature source Oral, resp. rate 18, height 5\' 8"  (1.727 m), weight 71.7 kg, last menstrual period 04/10/2021, SpO2 98 %. Body mass index is 24.02 kg/m.   Have you used any form of tobacco in the last 30 days? (Cigarettes, Smokeless Tobacco, Cigars, and/or Pipes): Yes  Has this patient used any form of tobacco in the last 30 days? (Cigarettes, Smokeless Tobacco, Cigars, and/or Pipes)  Yes, A prescription for an FDA-approved tobacco cessation medication was offered at discharge and the patient  refused  Blood Alcohol level:  Lab Results  Component Value Date   ETH 250 (H) 04/26/2021   ETH 212 (H) 06/28/2016    Metabolic Disorder Labs:  No results found for: HGBA1C, MPG No results found for: PROLACTIN No results found for: CHOL, TRIG, HDL, CHOLHDL, VLDL, LDLCALC  See Psychiatric Specialty Exam and Suicide Risk Assessment completed by Attending Physician prior to discharge.  Discharge destination:  Home  Is patient on multiple antipsychotic therapies at discharge:  No   Has Patient had three or more failed trials of antipsychotic monotherapy by history:  No  Recommended Plan for Multiple Antipsychotic Therapies: NA  Discharge Instructions    Diet general   Complete by: As directed    Increase activity slowly   Complete by: As directed      Allergies as of 04/29/2021      Reactions   Amoxicillin Rash      Medication List    TAKE these medications     Indication  buPROPion 150 MG 24 hr tablet Commonly known as: WELLBUTRIN XL Take 1 tablet (150 mg total) by mouth daily. Start taking on: Apr 30, 2021  Indication: Major Depressive Disorder   famotidine 20 MG tablet Commonly known as: PEPCID Take 1 tablet (20 mg total) by mouth 2 (two) times daily.  Indication: Gastroesophageal Reflux Disease   hydrOXYzine 25 MG tablet Commonly known as: ATARAX/VISTARIL Take 1 tablet (25 mg total) by mouth at bedtime as needed for anxiety (sleep).  Indication: Feeling Anxious, State of Being Sedated   naltrexone 50 MG tablet Commonly known as: DEPADE Take 1 tablet (50 mg total) by mouth daily. Start taking on: Apr 30, 2021  Indication: Abuse or Misuse of Alcohol        Follow-up recommendations:  Activity:  as tolerated Diet:  regular diet  Comments:  7-day supply of free medications provided to patient along with printed 30-day scripts with 1 refill.   Signed: May 02, 2021, MD 04/29/2021, 10:06 AM

## 2021-04-29 NOTE — Tx Team (Addendum)
Interdisciplinary Treatment and Diagnostic Plan Update  04/29/2021 Time of Session: 9:00AM Christine Allison MRN: [id number]  Principal Diagnosis: Major depressive disorder, recurrent severe without psychotic features (HCC)  Secondary Diagnoses: Principal Problem:   Major depressive disorder, recurrent severe without psychotic features (HCC) Active Problems:   Borderline personality disorder (HCC)   Alcohol use disorder, moderate, dependence (HCC)   Generalized anxiety disorder   Current Medications:  Current Facility-Administered Medications  Medication Dose Route Frequency Provider Last Rate Last Admin  . acetaminophen (TYLENOL) tablet 650 mg  650 mg Oral Q6H PRN [name], NP   650 mg at 04/28/21 2349  . alum & mag hydroxide-simeth (MAALOX/MYLANTA) 200-200-20 MG/5ML suspension 30 mL  30 mL Oral Q4H PRN [name], NP      . buPROPion (WELLBUTRIN XL) 24 hr tablet 150 mg  150 mg Oral Daily [name], MD   150 mg at 04/29/21 0815  . famotidine (PEPCID) tablet 20 mg  20 mg Oral BID [name], NP   20 mg at 04/29/21 0815  . hydrOXYzine (ATARAX/VISTARIL) tablet 25 mg  25 mg Oral QHS PRN [name], NP   25 mg at 04/28/21 2111  . magnesium hydroxide (MILK OF MAGNESIA) suspension 30 mL  30 mL Oral Daily PRN [name], NP   30 mL at 04/28/21 0815  . meclizine (ANTIVERT) tablet 12.5 mg  12.5 mg Oral TID PRN [name], MD      . multivitamin with minerals tablet 1 tablet  1 tablet Oral Daily [name], NP   1 tablet at 04/29/21 0815  . naltrexone (DEPADE) tablet 50 mg  50 mg Oral Daily [name], MD   50 mg at 04/29/21 0817  . nicotine (NICODERM CQ - dosed in mg/24 hours) patch 21 mg  21 mg Transdermal Daily [name], MD   21 mg at 04/29/21 0815  . thiamine tablet 100 mg  100 mg Oral Daily [name], MD   100 mg at 04/29/21 4097   PTA Medications: Medications Prior to Admission  Medication Sig Dispense Refill Last Dose  .  famotidine (PEPCID) 20 MG tablet Take 1 tablet (20 mg total) by mouth 2 (two) times daily. 30 tablet 0     Patient Stressors: Medication change or noncompliance Substance abuse Traumatic event  Patient Strengths: Average or above average intelligence Communication skills Physical Health Supportive family/friends Work skills  Treatment Modalities: Medication Management, Group therapy, Case management,  1 to 1 session with clinician, Psychoeducation, Recreational therapy.   Physician Treatment Plan for Primary Diagnosis: Major depressive disorder, recurrent severe without psychotic features (HCC) Long Term Goal(s): Improvement in symptoms so as ready for discharge Improvement in symptoms so as ready for discharge   Short Term Goals: Ability to identify changes in lifestyle to reduce recurrence of condition will improve Ability to verbalize feelings will improve Ability to disclose and discuss suicidal ideas Ability to demonstrate self-control will improve Ability to identify and develop effective coping behaviors will improve Ability to maintain clinical measurements within normal limits will improve Compliance with prescribed medications will improve Ability to identify triggers associated with substance abuse/mental health issues will improve Ability to identify changes in lifestyle to reduce recurrence of condition will improve Ability to verbalize feelings will improve Ability to disclose and discuss suicidal ideas Ability to demonstrate self-control will improve Ability to identify and develop effective coping behaviors will improve Ability to maintain clinical measurements within normal  limits will improve Compliance with prescribed medications will improve Ability to identify triggers associated with substance abuse/mental health issues will improve  Medication Management: Evaluate patient's response, side effects, and tolerance of medication regimen.  Therapeutic  Interventions: 1 to 1 sessions, Unit Group sessions and Medication administration.  Evaluation of Outcomes: Adequate for Discharge  Physician Treatment Plan for Secondary Diagnosis: Principal Problem:   Major depressive disorder, recurrent severe without psychotic features (HCC) Active Problems:   Borderline personality disorder (HCC)   Alcohol use disorder, moderate, dependence (HCC)   Generalized anxiety disorder  Long Term Goal(s): Improvement in symptoms so as ready for discharge Improvement in symptoms so as ready for discharge   Short Term Goals: Ability to identify changes in lifestyle to reduce recurrence of condition will improve Ability to verbalize feelings will improve Ability to disclose and discuss suicidal ideas Ability to demonstrate self-control will improve Ability to identify and develop effective coping behaviors will improve Ability to maintain clinical measurements within normal limits will improve Compliance with prescribed medications will improve Ability to identify triggers associated with substance abuse/mental health issues will improve Ability to identify changes in lifestyle to reduce recurrence of condition will improve Ability to verbalize feelings will improve Ability to disclose and discuss suicidal ideas Ability to demonstrate self-control will improve Ability to identify and develop effective coping behaviors will improve Ability to maintain clinical measurements within normal limits will improve Compliance with prescribed medications will improve Ability to identify triggers associated with substance abuse/mental health issues will improve     Medication Management: Evaluate patient's response, side effects, and tolerance of medication regimen.  Therapeutic Interventions: 1 to 1 sessions, Unit Group sessions and Medication administration.  Evaluation of Outcomes: Adequate for Discharge   RN Treatment Plan for Primary Diagnosis: Major  depressive disorder, recurrent severe without psychotic features (HCC) Long Term Goal(s): Knowledge of disease and therapeutic regimen to maintain health will improve  Short Term Goals: Ability to remain free from injury will improve, Ability to demonstrate self-control, Ability to participate in decision making will improve, Ability to verbalize feelings will improve, Ability to disclose and discuss suicidal ideas, Ability to identify and develop effective coping behaviors will improve and Compliance with prescribed medications will improve  Medication Management: RN will administer medications as ordered by provider, will assess and evaluate patient's response and provide education to patient for prescribed medication. RN will report any adverse and/or side effects to prescribing provider.  Therapeutic Interventions: 1 on 1 counseling sessions, Psychoeducation, Medication administration, Evaluate responses to treatment, Monitor vital signs and CBGs as ordered, Perform/monitor CIWA, COWS, AIMS and Fall Risk screenings as ordered, Perform wound care treatments as ordered.  Evaluation of Outcomes: Adequate for Discharge   LCSW Treatment Plan for Primary Diagnosis: Major depressive disorder, recurrent severe without psychotic features (HCC) Long Term Goal(s): Safe transition to appropriate next level of care at discharge, Engage patient in therapeutic group addressing interpersonal concerns.  Short Term Goals: Engage patient in aftercare planning with referrals and resources, Increase social support, Increase ability to appropriately verbalize feelings, Increase emotional regulation, Facilitate acceptance of mental health diagnosis and concerns, Identify triggers associated with mental health/substance abuse issues and Increase skills for wellness and recovery  Therapeutic Interventions: Assess for all discharge needs, 1 to 1 time with Social worker, Explore available resources and support systems,  Assess for adequacy in community support network, Educate family and significant other(s) on suicide prevention, Complete Psychosocial Assessment, Interpersonal group therapy.  Evaluation of Outcomes: Adequate for Discharge  Progress in Treatment: Attending groups: No. Participating in groups: No. Taking medication as prescribed: Yes. Toleration medication: Yes. Family/Significant other contact made: Yes, individual(s) contacted:  completed with patient Patient understands diagnosis: Yes. Discussing patient identified problems/goals with staff: Yes. Medical problems stabilized or resolved: Yes. Denies suicidal/homicidal ideation: Yes. Issues/concerns per patient self-inventory: No. Other: None  New problem(s) identified: No, Describe:  None  New Short Term/Long Term Goal(s): detox, elimination of symptoms of psychosis, medication management for mood stabilization; elimination of SI thoughts; development of comprehensive mental wellness/sobriety plan.   Patient Goals:  "to go home and get a therapy appointment set up."  Discharge Plan or Barriers: CSW will assist pt with appropriate  discharge/aftercare planning.   Reason for Continuation of Hospitalization: Anxiety Depression Suicidal ideation  Estimated Length of Stay: 1-7 days  Recreational Therapy: Patient Stressors: N/A Patient Goal: Patient will engage in groups without prompting or encouragement from LRT x3 group sessions within 5 recreation therapy group sessions.  Attendees: Patient: Christine Allison 04/29/2021 11:34 AM  Physician: [name], MD  04/29/2021 11:34 AM  Nursing: Cecille Amsterdam, RN  04/29/2021 11:34 AM  RN Care Manager: 04/29/2021 11:34 AM  Social Worker: Kiva [place], MSW, LCSW-A  04/29/2021 11:34 AM  Recreational Therapist: Garret Reddish, Drue Flirt, LRT  04/29/2021 11:34 AM  Other: Gwenevere Ghazi, LCSW-A,LCAS-A  04/29/2021 11:34 AM  Other:  04/29/2021 11:34 AM  Other: 04/29/2021 11:34 AM    Scribe  for Treatment Team: Kiva A [place], LCSWA 04/29/2021 11:34 AM

## 2021-05-06 LAB — VITAMIN B1: Vitamin B1 (Thiamine): 135.8 nmol/L (ref 66.5–200.0)

## 2022-01-31 IMAGING — CT CT ABD-PELV W/ CM
2 of 7 series · 15 of 46 positions shown, 19 images · IV contrast (APPLIED)
Comparison: CT 09/16/2007

CLINICAL DATA: Upper abdominal pain with nausea

EXAM:
CT ABDOMEN AND PELVIS WITH CONTRAST
TECHNIQUE: Multidetector CT imaging of the abdomen and pelvis was performed
using the standard protocol following bolus administration of
intravenous contrast.
CONTRAST:  100mL OMNIPAQUE IOHEXOL 300 MG/ML  SOLN

[Series 2: axial st · axial · 0.75mm/px · z∈[-889,-509]mm · 12 of 90 slices shown, 16 images]
[im 9/90  soft-tissue]
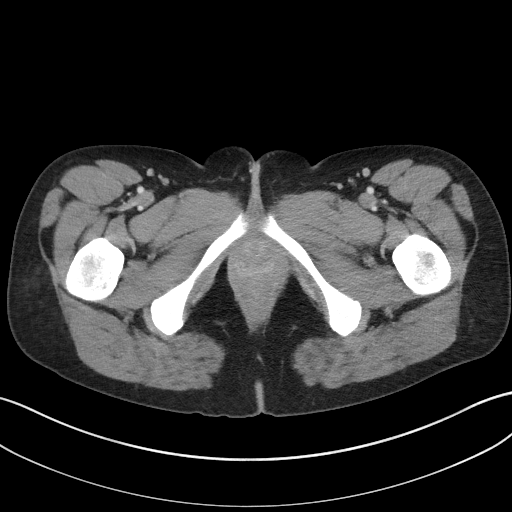
[im 9/90  bone]
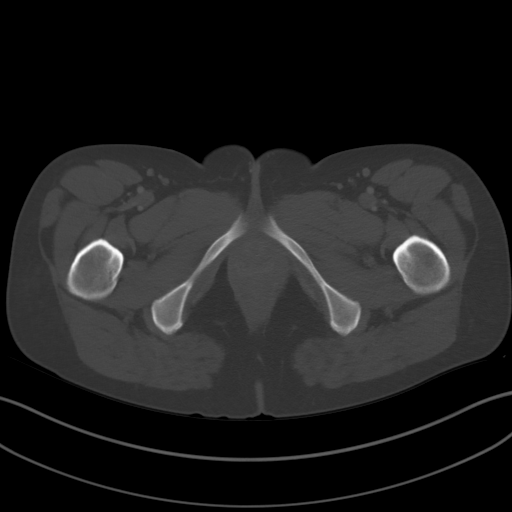
[im 17/90  soft-tissue]
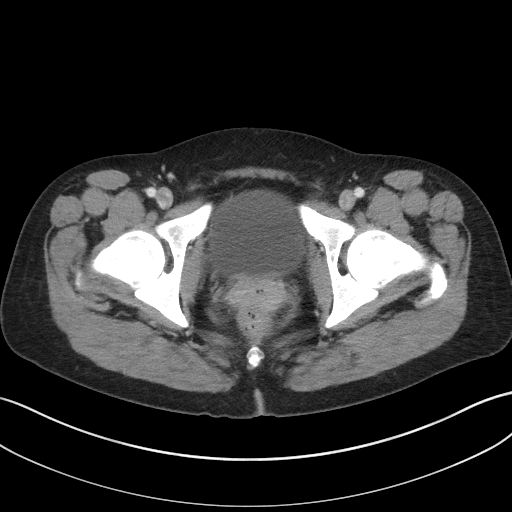
[im 25/90  soft-tissue]
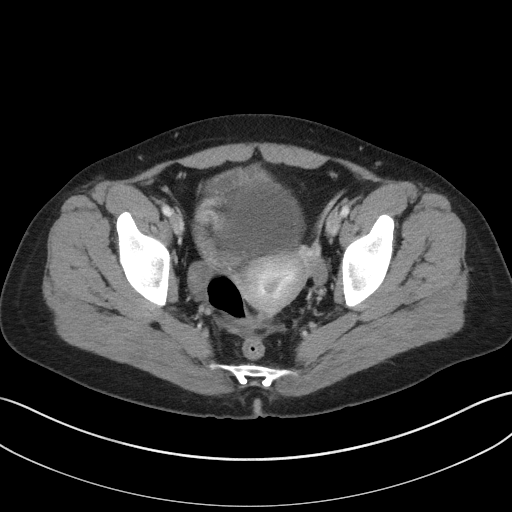
[im 33/90  soft-tissue]
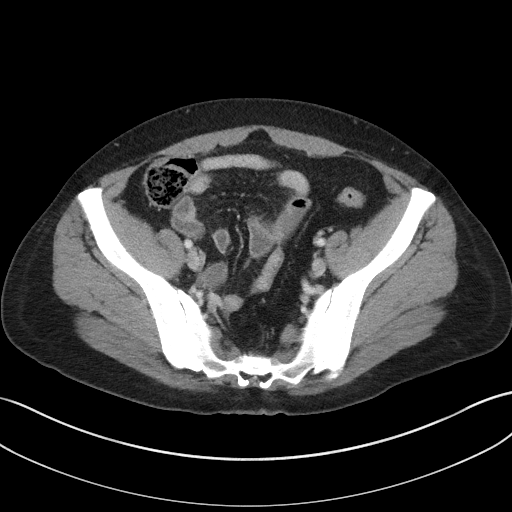
[im 41/90  soft-tissue]
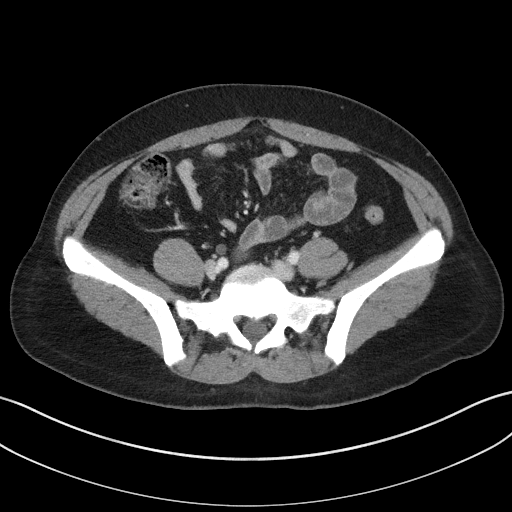
[im 49/90  soft-tissue]
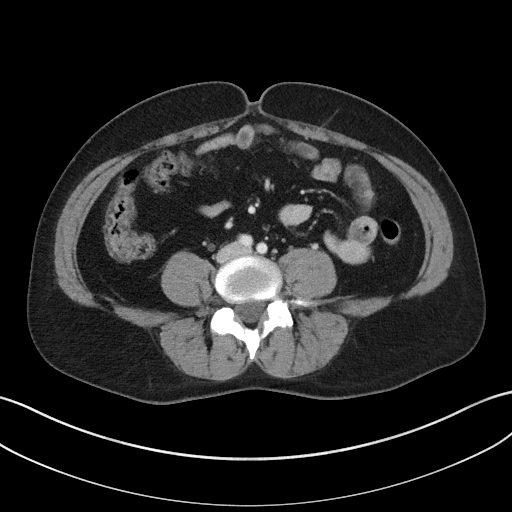
[im 57/90  soft-tissue]
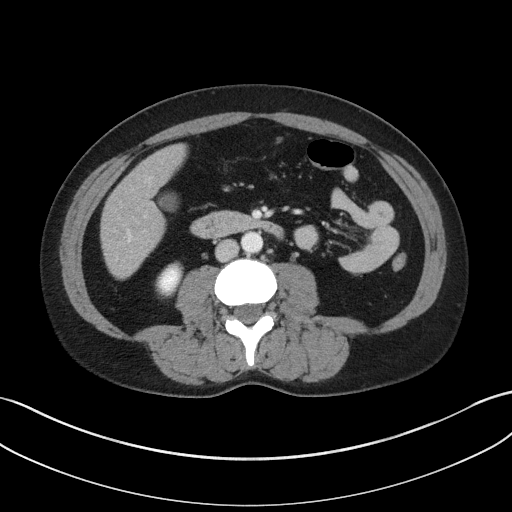
[im 65/90  soft-tissue]
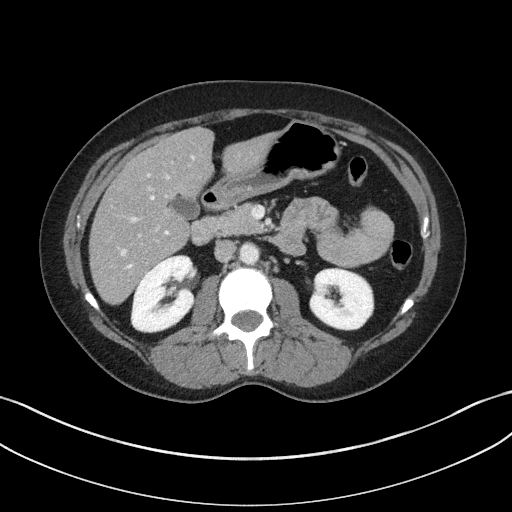
[im 73/90  soft-tissue]
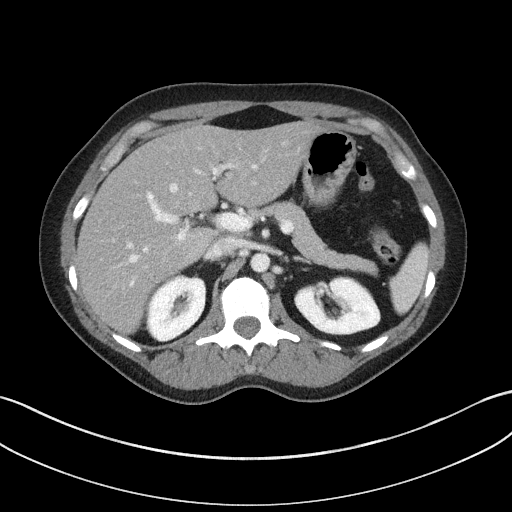
[im 73/90  lung]
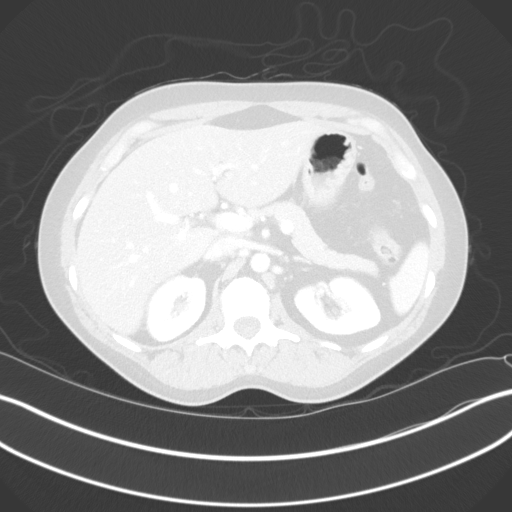
[im 73/90  bone]
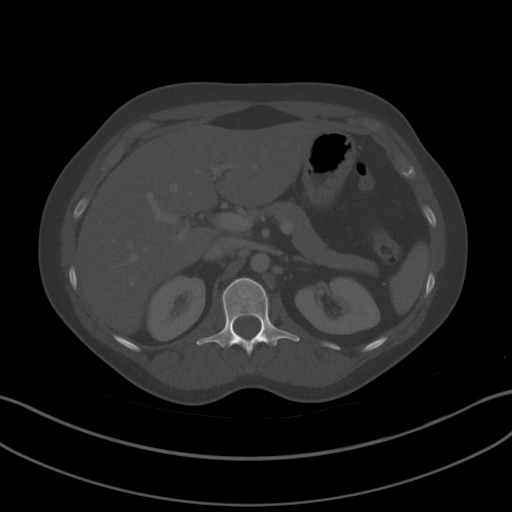
[im 77/90  lung]
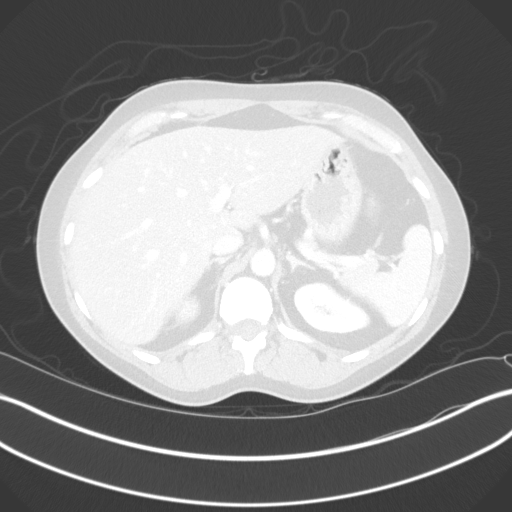
[im 81/90  soft-tissue]
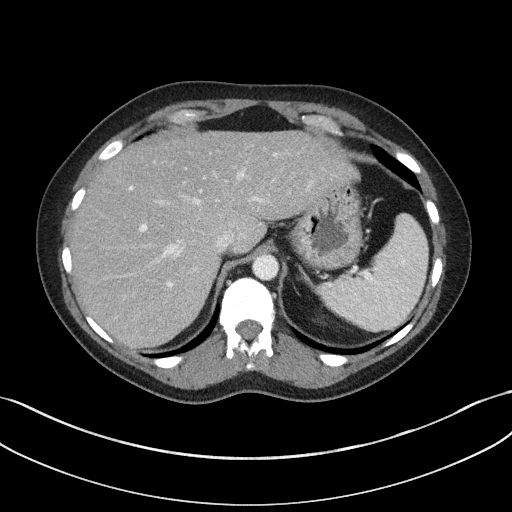
[im 81/90  lung]
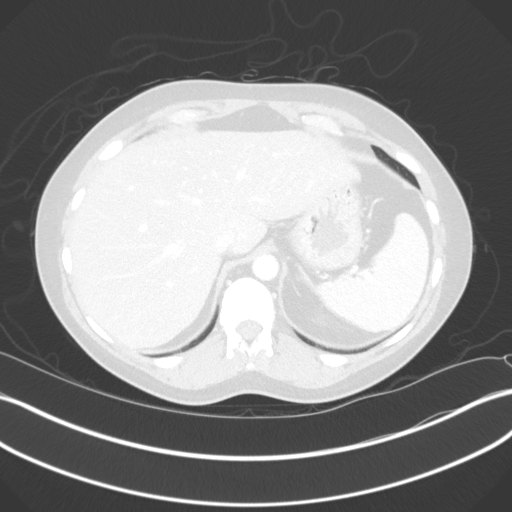
[im 85/90  lung]
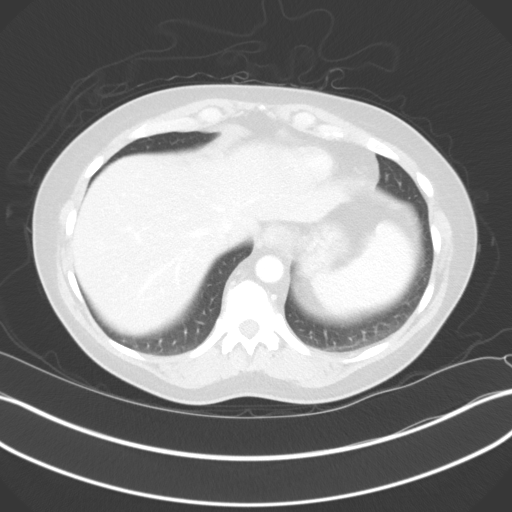

[Series 8: coronal st · coronal · 0.77mm/px · 3 of 84 slices shown]
[im 21/84  soft-tissue]
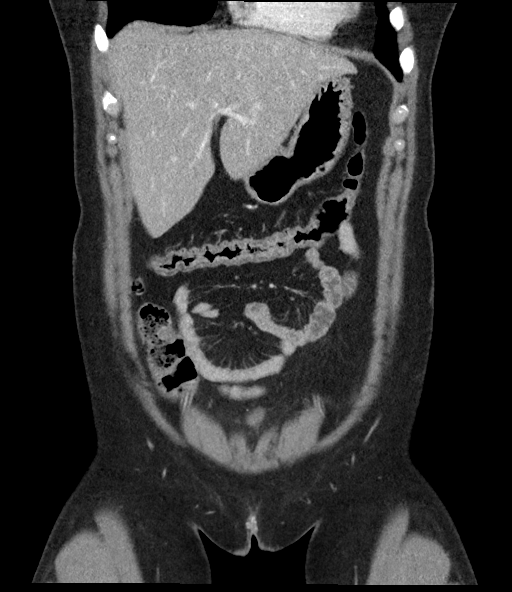
[im 42/84  soft-tissue]
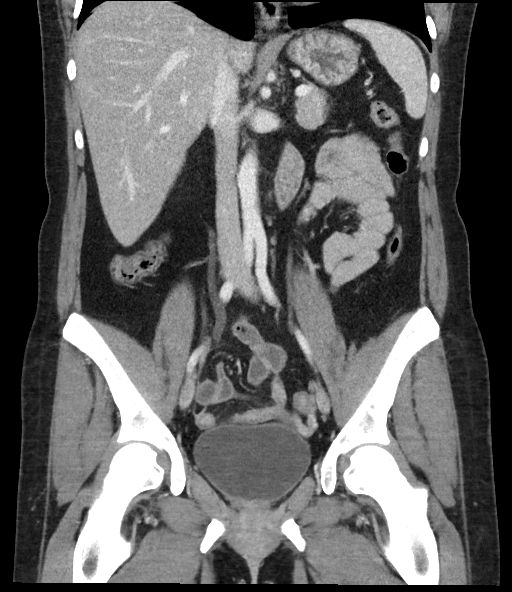
[im 63/84  soft-tissue]
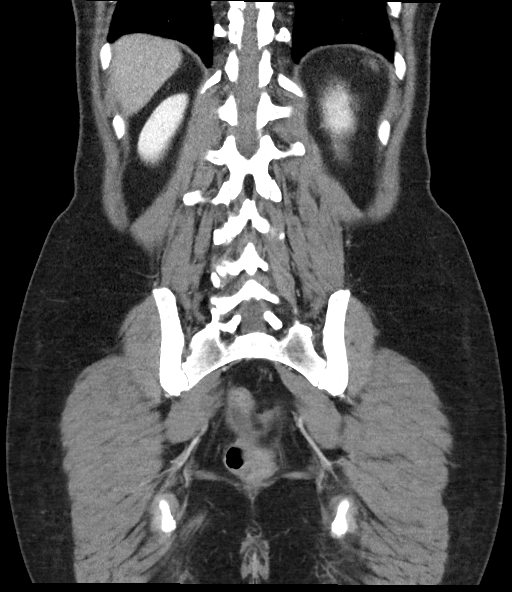

[15 of 46 positions shown; findings below may reference images not displayed]

FINDINGS: Lower chest: No acute abnormality.

Hepatobiliary: Mild steatosis. No calcified gallstone or biliary
dilatation.

Pancreas: Unremarkable. No pancreatic ductal dilatation or
surrounding inflammatory changes.

Spleen: Normal in size without focal abnormality.

Adrenals/Urinary Tract: Adrenal glands are unremarkable. Kidneys are
normal, without renal calculi, focal lesion, or hydronephrosis.
Bladder is unremarkable.

Stomach/Bowel: Stomach is within normal limits. Status post
appendectomy. No evidence of bowel wall thickening, distention, or
inflammatory changes.

Vascular/Lymphatic: No significant vascular findings are present. No
enlarged abdominal or pelvic lymph nodes.

Reproductive: Probable arcuate configuration of the uterus. No
suspicious adnexal mass

Other: Small free fluid in the pelvis.  No free air

Musculoskeletal: No acute or significant osseous findings.
IMPRESSION: 1. No CT evidence for acute intra-abdominal or pelvic abnormality.
2. Mild hepatic steatosis.

## 2022-05-12 ENCOUNTER — Encounter: Payer: Self-pay | Admitting: Emergency Medicine

## 2022-05-12 ENCOUNTER — Emergency Department
Admission: EM | Admit: 2022-05-12 | Discharge: 2022-05-12 | Disposition: A | Discharge status: Home / Self Care | Payer: Self-pay | Attending: Emergency Medicine | Admitting: Emergency Medicine

## 2022-05-12 ENCOUNTER — Emergency Department: Payer: Self-pay

## 2022-05-12 ENCOUNTER — Other Ambulatory Visit: Payer: Self-pay

## 2022-05-12 DIAGNOSIS — M549 Dorsalgia, unspecified: Secondary | ICD-10-CM | POA: Insufficient documentation

## 2022-05-12 DIAGNOSIS — R079 Chest pain, unspecified: Secondary | ICD-10-CM

## 2022-05-12 DIAGNOSIS — R0789 Other chest pain: Secondary | ICD-10-CM | POA: Insufficient documentation

## 2022-05-12 DIAGNOSIS — R42 Dizziness and giddiness: Secondary | ICD-10-CM | POA: Insufficient documentation

## 2022-05-12 LAB — CBC
HCT: 42.7 % (ref 36.0–46.0)
Hemoglobin: 14.7 g/dL (ref 12.0–15.0)
MCH: 34.3 pg — ABNORMAL HIGH (ref 26.0–34.0)
MCHC: 34.4 g/dL (ref 30.0–36.0)
MCV: 99.8 fL (ref 80.0–100.0)
Platelets: 241 10*3/uL (ref 150–400)
RBC: 4.28 MIL/uL (ref 3.87–5.11)
RDW: 11.7 % (ref 11.5–15.5)
WBC: 9.4 10*3/uL (ref 4.0–10.5)
nRBC: 0 % (ref 0.0–0.2)

## 2022-05-12 LAB — BASIC METABOLIC PANEL
Anion gap: 11 (ref 5–15)
BUN: 12 mg/dL (ref 6–20)
CO2: 25 mmol/L (ref 22–32)
Calcium: 10.2 mg/dL (ref 8.9–10.3)
Chloride: 101 mmol/L (ref 98–111)
Creatinine, Ser: 0.7 mg/dL (ref 0.44–1.00)
GFR, Estimated: >60 mL/min (ref >60)
Glucose, Bld: 107 mg/dL — ABNORMAL HIGH (ref 70–99)
Potassium: 3.9 mmol/L (ref 3.5–5.1)
Sodium: 137 mmol/L (ref 135–145)

## 2022-05-12 LAB — TROPONIN I (HIGH SENSITIVITY): Troponin I (High Sensitivity): 4 ng/L (ref <18)

## 2022-05-12 LAB — POC URINE PREG, ED: Preg Test, Ur: NEGATIVE

## 2022-05-12 MED ORDER — IOHEXOL 350 MG/ML SOLN
75.0000 mL | Freq: Once | INTRAVENOUS | Status: AC | PRN
  Administered 2022-05-12: 75 mL via INTRAVENOUS

## 2022-05-12 MED ORDER — KETOROLAC TROMETHAMINE 30 MG/ML IJ SOLN
30.0000 mg | Freq: Once | INTRAMUSCULAR | Status: AC
  Administered 2022-05-12: 30 mg via INTRAVENOUS
  Filled 2022-05-12: qty 1

## 2022-05-12 NOTE — ED Notes (Signed)
dc ppw provided to patient. qusetions, followup and rx info addressed. PT provides verbal consent for dc at this time. pt ambulatory to lobby alert and oriented x4. 

## 2022-05-12 NOTE — ED Provider Triage Note (Signed)
Emergency Medicine Provider Triage Evaluation Note  Christine Allison, a 41 y.o. female  was evaluated in triage.  Pt complains of chest pain. She notes onset last night. She notes central chest tightness with some referral to her left neck last night.   Review of Systems  Positive: CP Negative: NV  Physical Exam  BP 115/67   Pulse 73   Temp 98.6 F (37 C) (Oral)   Resp 18   Ht 5\' 8"  (1.727 m)   Wt 74.8 kg   LMP 05/03/2022   SpO2 98%   BMI 25.09 kg/m  Gen:   Awake, no distress  NAD Resp:  Normal effort CTA MSK:   Moves extremities without difficulty  CVS:  RRR  Medical Decision Making  Medically screening exam initiated at 4:43 PM.  Appropriate orders placed.  05/05/2022 was informed that the remainder of the evaluation will be completed by another provider, this initial triage assessment does not replace that evaluation, and the importance of remaining in the ED until their evaluation is complete.  Patient to the ED for evaluation of central chest pain with onset last night.  Patient had associated referred pain to the left left neck, but denies any nausea, vomiting, or diaphoresis.   Merlyn Albert, PA-C 05/12/22 1646

## 2022-05-12 NOTE — ED Provider Notes (Signed)
Emergency department handoff note  Care of this patient was signed out to me at the end of the previous provider shift.  All pertinent patient information was conveyed and all questions were answered.  Patient pending CT angiography of the chest that did not show any evidence of acute abnormalities.  The patient has been reexamined and is ready to be discharged.  All diagnostic results have been reviewed and discussed with the patient/family.  Care plan has been outlined and the patient/family understands all current diagnoses, results, and treatment plans.  There are no new complaints, changes, or physical findings at this time.  All questions have been addressed and answered.  Patient was instructed to, and agrees to follow-up with their primary care physician as well as return to the emergency department if any new or worsening symptoms develop.   Merwyn Katos, MD 05/12/22 [phone]

## 2022-05-12 NOTE — ED Provider Notes (Signed)
[facility] Provider Note    Event Date/Time   First MD Initiated Contact with Patient 05/12/22 1650     (approximate)   History   Chest Pain   HPI  Christine Allison is a 41 y.o. female who presents with complaints of chest pain.  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  She tried to ignore it but started to feel little lightheaded.  She went home and was able to sleep, this morning she reports she woke up with similar pain.  No history of heart disease.  No fevers chills or cough.  No shortness of breath.  No pleurisy.     Physical Exam   Triage Vital Signs: ED Triage Vitals  Enc Vitals Group     BP 05/12/22 1637 115/67     Pulse Rate 05/12/22 1637 73     Resp 05/12/22 1637 18     Temp 05/12/22 1637 98.6 F (37 C)     Temp Source 05/12/22 1637 Oral     SpO2 05/12/22 1637 98 %     Weight 05/12/22 1638 74.8 kg (165 lb)     Height 05/12/22 1638 1.727 m (5\' 8" )     Head Circumference --      Peak Flow --      Pain Score 05/12/22 1638 5     Pain Loc --      Pain Edu? --      Excl. in GC? --     Most recent vital signs: Vitals:   05/12/22 1637  BP: 115/67  Pulse: 73  Resp: 18  Temp: 98.6 F (37 C)  SpO2: 98%     General: Awake, no distress.  CV:  Good peripheral perfusion.  Regular rate and rhythm Resp:  Normal effort.  CTA bilaterally Abd:  No distention.  Other:     ED Results / Procedures / Treatments   Labs (all labs ordered are listed, but only abnormal results are displayed) Labs Reviewed  BASIC METABOLIC PANEL - Abnormal; Notable for the following components:      Result Value   Glucose, Bld 107 (*)    All other components within normal limits  CBC - Abnormal; Notable for the following components:   MCH 34.3 (*)    All other components within normal limits  POC URINE PREG, ED  TROPONIN I (HIGH SENSITIVITY)      EKG  ED ECG REPORT I, 05/14/22, the attending physician, personally viewed and interpreted this ECG.  Date: 05/12/2022  Rhythm: normal sinus rhythm QRS Axis: normal Intervals: normal ST/T Wave abnormalities: normal Narrative Interpretation: no evidence of acute ischemia    RADIOLOGY Chest x-ray viewed interpret by me, no acute abnormality CT angiography negative for aortic disease    PROCEDURES:  Critical Care performed:   Procedures   MEDICATIONS ORDERED IN ED: Medications  ketorolac (TORADOL) 30 MG/ML injection 30 mg (30 mg Intravenous Given 05/12/22 1730)  iohexol (OMNIPAQUE) 350 MG/ML injection 75 mL (75 mLs Intravenous Contrast Given 05/12/22 1748)     IMPRESSION / MDM / ASSESSMENT AND PLAN / ED COURSE  I reviewed the triage vital signs and the nursing notes. Patient's presentation is most consistent with acute presentation with potential threat to life or bodily function.  Patient presents with chest pain as detailed above.  Differential includes ACS, musculoskeletal pain, aortic  dissection, PE  EKG and troponin are reassuring  BMP and glucose are normal  X-ray without evidence of pneumonia or pneumothorax  CT angiography of the chest negative for dissection or PE  Patient feeling better after IV Toradol, appropriate for discharge with outpatient follow-up with cardiology        FINAL CLINICAL IMPRESSION(S) / ED DIAGNOSES   Final diagnoses:  Chest pain, unspecified type  Lightheadedness     Rx / DC Orders   ED Discharge Orders     None        Note:  This document was prepared using Dragon voice recognition software and may include unintentional dictation errors.   Jene Every, MD 05/12/22 2255

## 2022-05-12 NOTE — ED Triage Notes (Signed)
Pt via POV from home. Pt c/o mid-chest pain that radiates to her back. Denies cardiac hx. States that it started last night, she was also dizzy and SOB. Pt is A&OX4 and NAD

## 2023-11-26 IMAGING — CT CT ANGIO CHEST
3 of 6 series · 19 of 46 positions shown · IV contrast (APPLIED)
Comparison: Chest radiographs dated 05/12/2022

CLINICAL DATA: Chest patent radiating to back, dizziness, shortness
of breath

EXAM:
CT ANGIOGRAPHY CHEST WITH CONTRAST
TECHNIQUE: Multidetector CT imaging of the chest was performed using the
standard protocol during bolus administration of intravenous
contrast. Multiplanar CT image reconstructions and MIPs were
obtained to evaluate the vascular anatomy.

[Series 5: arterial · axial · arterial · 0.58mm/px · z∈[+1184,+1478]mm · 14 of 171 slices shown]
[im 12/171  lung]
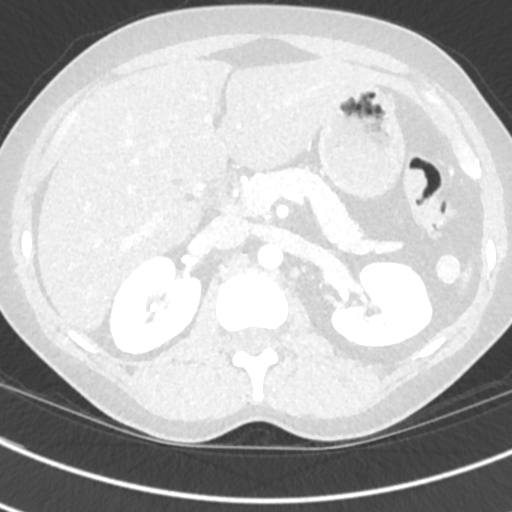
[im 23/171  soft-tissue]
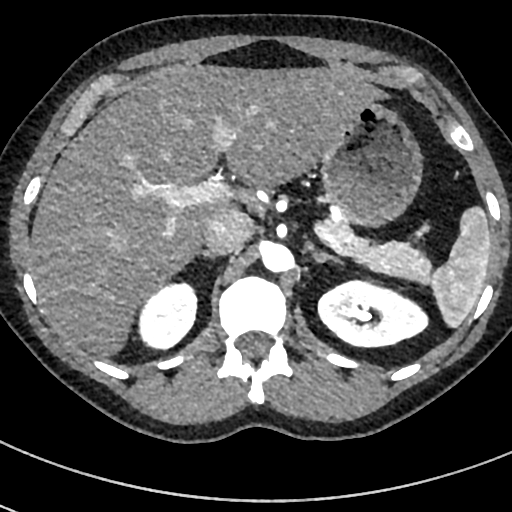
[im 35/171  lung]
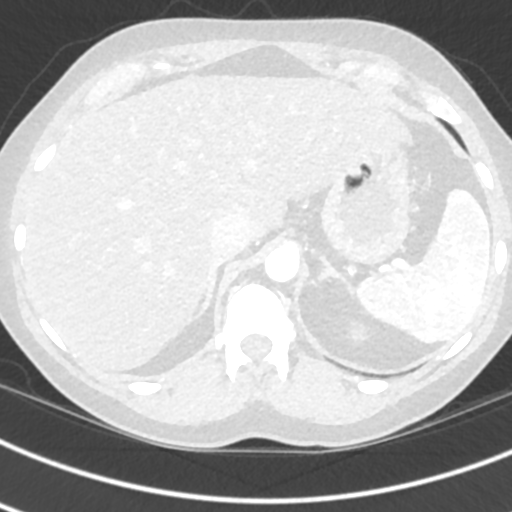
[im 46/171  soft-tissue]
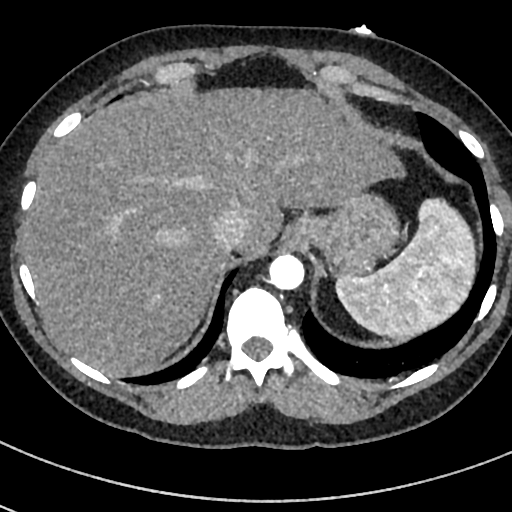
[im 57/171  lung]
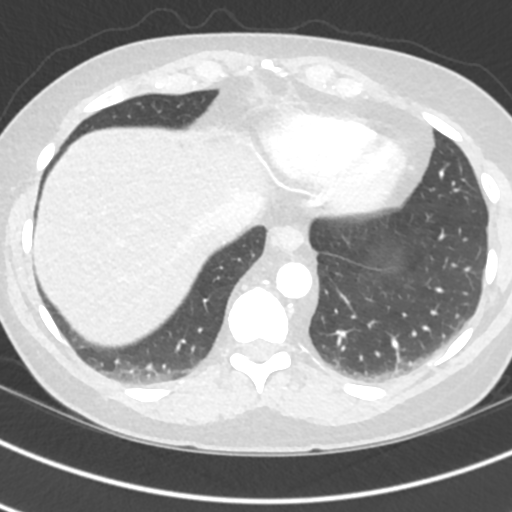
[im 69/171  soft-tissue]
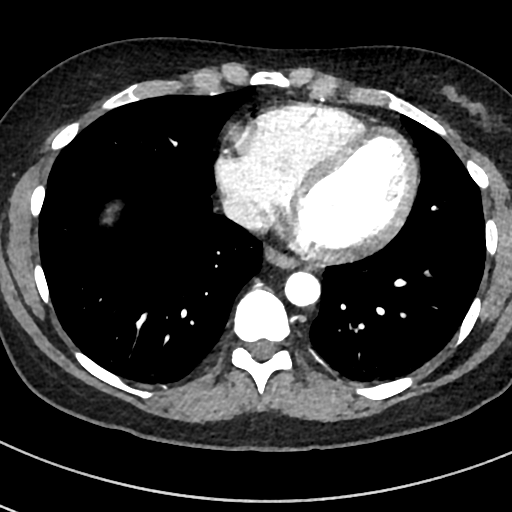
[im 80/171  lung]
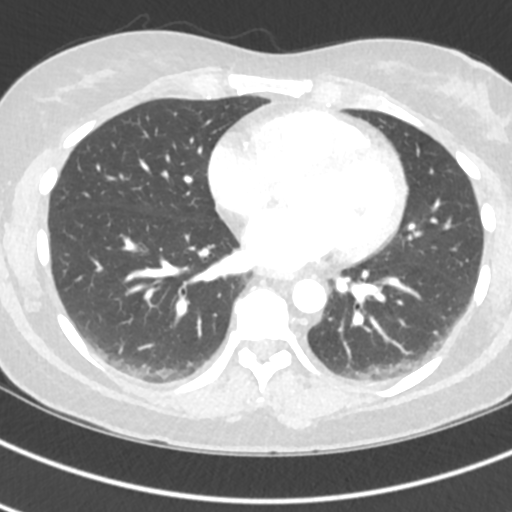
[im 91/171  soft-tissue]
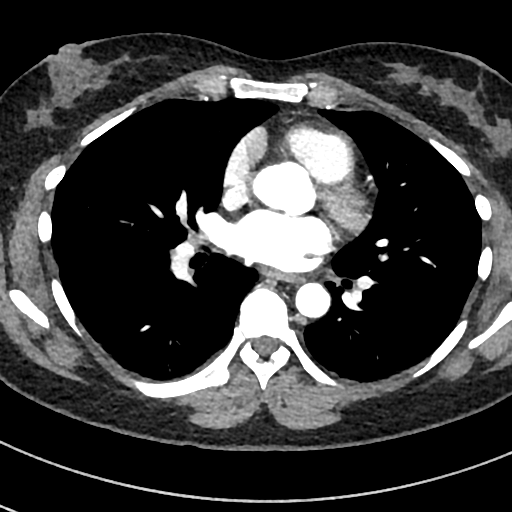
[im 103/171  lung]
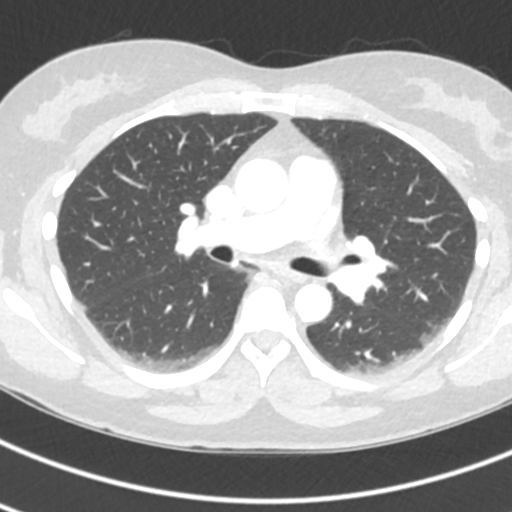
[im 114/171  soft-tissue]
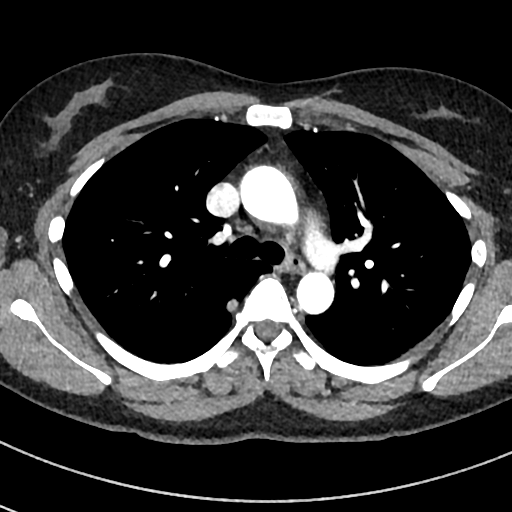
[im 125/171  lung]
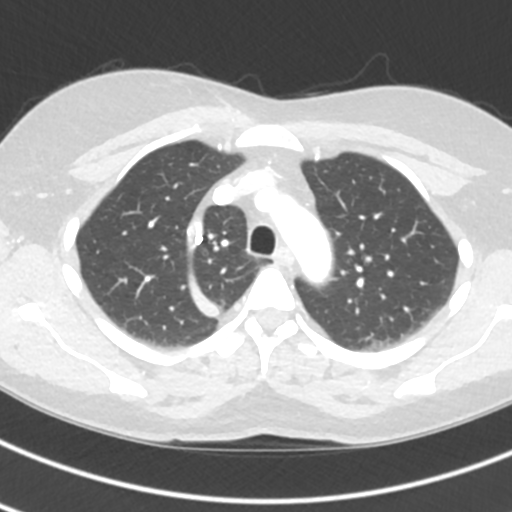
[im 137/171  soft-tissue]
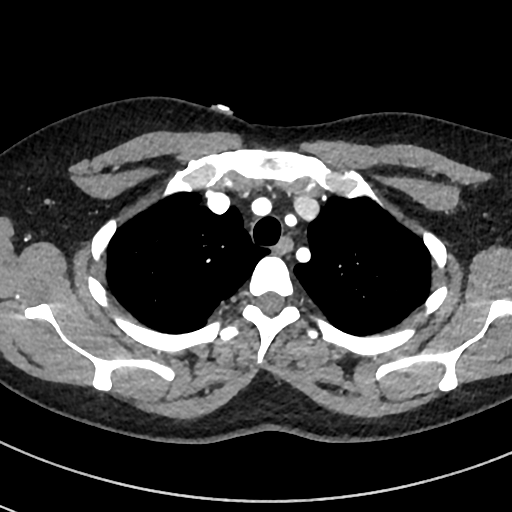
[im 148/171  lung]
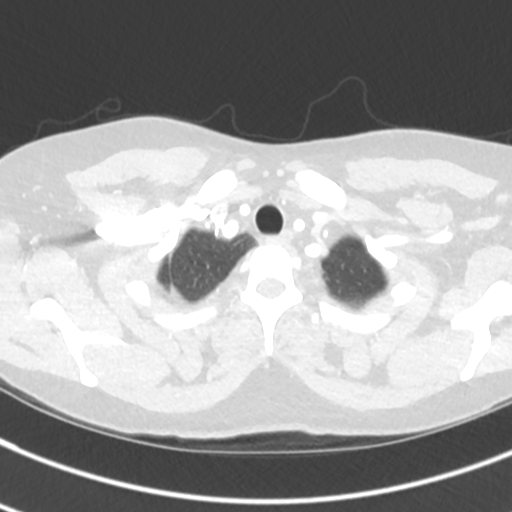
[im 159/171  soft-tissue]
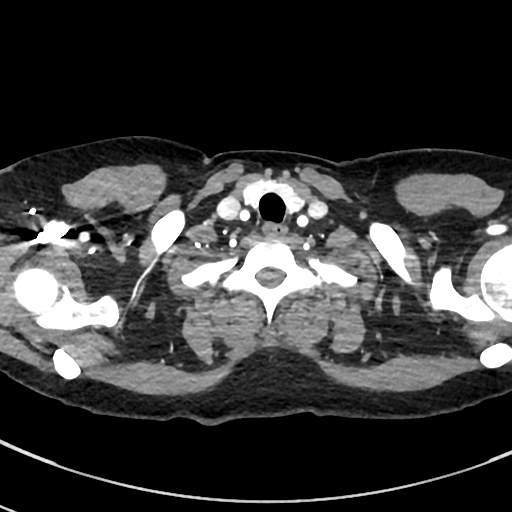

[Series 6: lung · axial · 0.58mm/px · z∈[+1184,+1230]mm · 2 of 171 slices shown]
[im 12/171  soft-tissue]
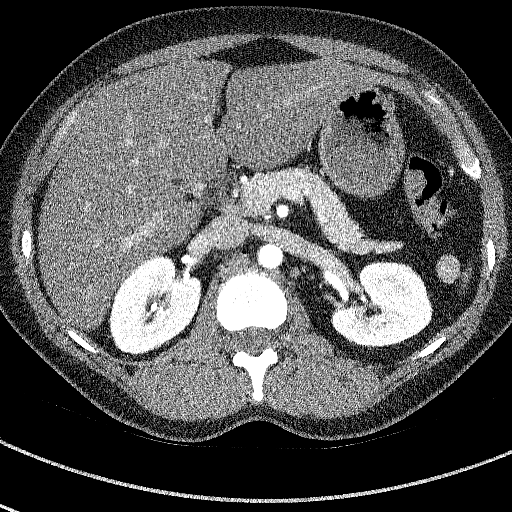
[im 35/171  soft-tissue]
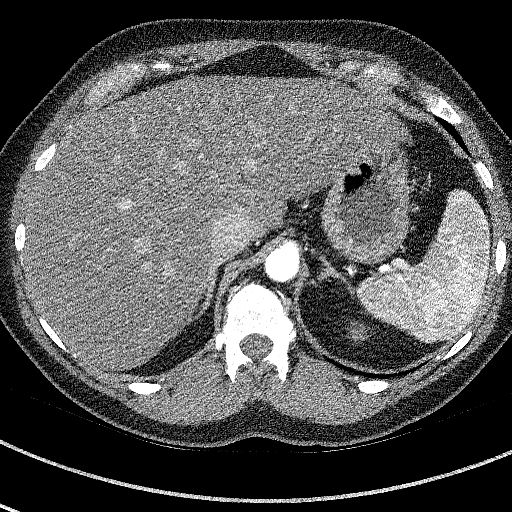

[Series 8: cor · coronal · 0.67mm/px · 3 of 119 slices shown]
[im 30/119  soft-tissue]
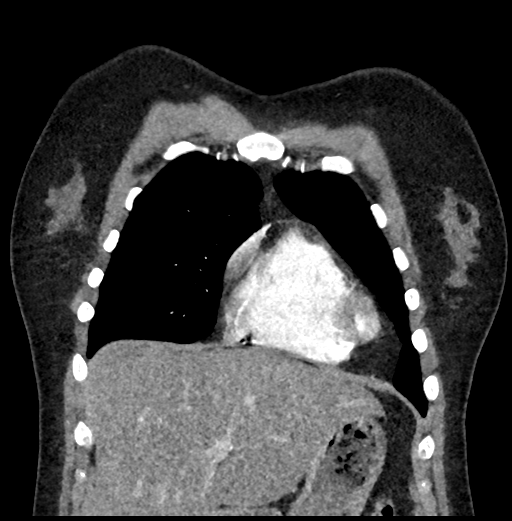
[im 60/119  soft-tissue]
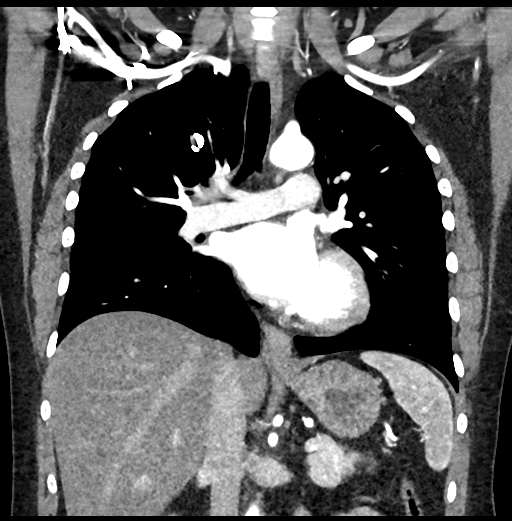
[im 89/119  soft-tissue]
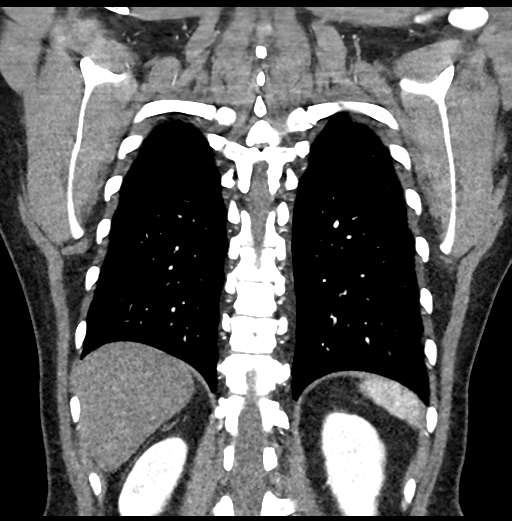

[19 of 46 positions shown; findings below may reference images not displayed]

RADIATION DOSE REDUCTION: This exam was performed according to the
departmental dose-optimization program which includes automated
exposure control, adjustment of the mA and/or kV according to
patient size and/or use of iterative reconstruction technique.

CONTRAST:  75mL OMNIPAQUE IOHEXOL 350 MG/ML SOLN
FINDINGS: Cardiovascular: Preferential opacification of the thoracic aorta. No
evidence of thoracic aortic aneurysm or dissection.

Study is not tailored for evaluation of the pulmonary arteries. No
evidence of central pulmonary embolism.

The heart is normal in size.  No pericardial effusion.

Mediastinum/Nodes: No suspicious mediastinal lymphadenopathy.

Visualized thyroid is unremarkable.

Lungs/Pleura: Lungs are essentially clear.

Mild dependent atelectasis in the bilateral lower lobes.

No focal consolidation.

Azygous fissure.

No pleural effusion or pneumothorax.

Upper Abdomen: Visualized upper abdomen is within normal limits.

Musculoskeletal: Visualized osseous structures are within normal
limits.

Review of the MIP images confirms the above findings.
IMPRESSION: No evidence of thoracic aortic aneurysm or dissection.

No evidence of central pulmonary embolism.

Negative CT chest.

## 2024-05-09 ENCOUNTER — Emergency Department
Admission: EM | Admit: 2024-05-09 | Discharge: 2024-05-09 | Disposition: A | Discharge status: Home / Self Care | Payer: Self-pay | Attending: Emergency Medicine | Admitting: Emergency Medicine

## 2024-05-09 ENCOUNTER — Other Ambulatory Visit: Payer: Self-pay

## 2024-05-09 ENCOUNTER — Emergency Department: Payer: Self-pay

## 2024-05-09 DIAGNOSIS — K529 Noninfective gastroenteritis and colitis, unspecified: Secondary | ICD-10-CM | POA: Insufficient documentation

## 2024-05-09 DIAGNOSIS — R1032 Left lower quadrant pain: Secondary | ICD-10-CM

## 2024-05-09 DIAGNOSIS — K7689 Other specified diseases of liver: Secondary | ICD-10-CM | POA: Insufficient documentation

## 2024-05-09 LAB — CBC
HCT: 43.9 % (ref 36.0–46.0)
Hemoglobin: 15.5 g/dL — ABNORMAL HIGH (ref 12.0–15.0)
MCH: 34.8 pg — ABNORMAL HIGH (ref 26.0–34.0)
MCHC: 35.3 g/dL (ref 30.0–36.0)
MCV: 98.7 fL (ref 80.0–100.0)
Platelets: 228 10*3/uL (ref 150–400)
RBC: 4.45 MIL/uL (ref 3.87–5.11)
RDW: 11.4 % — ABNORMAL LOW (ref 11.5–15.5)
WBC: 9.7 10*3/uL (ref 4.0–10.5)
nRBC: 0 % (ref 0.0–0.2)

## 2024-05-09 LAB — URINALYSIS, ROUTINE W REFLEX MICROSCOPIC
Bilirubin Urine: NEGATIVE
Glucose, UA: NEGATIVE mg/dL
Hgb urine dipstick: NEGATIVE
Ketones, ur: NEGATIVE mg/dL
Leukocytes,Ua: NEGATIVE
Nitrite: NEGATIVE
Protein, ur: NEGATIVE mg/dL
Specific Gravity, Urine: 1.012 (ref 1.005–1.030)
pH: 5 (ref 5.0–8.0)

## 2024-05-09 LAB — COMPREHENSIVE METABOLIC PANEL WITH GFR
ALT: 44 U/L (ref 0–44)
AST: 28 U/L (ref 15–41)
Albumin: 4.4 g/dL (ref 3.5–5.0)
Alkaline Phosphatase: 68 U/L (ref 38–126)
Anion gap: 8 (ref 5–15)
BUN: 6 mg/dL (ref 6–20)
CO2: 24 mmol/L (ref 22–32)
Calcium: 9.7 mg/dL (ref 8.9–10.3)
Chloride: 105 mmol/L (ref 98–111)
Creatinine, Ser: 0.74 mg/dL (ref 0.44–1.00)
GFR, Estimated: >60 mL/min (ref >60)
Glucose, Bld: 122 mg/dL — ABNORMAL HIGH (ref 70–99)
Potassium: 3.6 mmol/L (ref 3.5–5.1)
Sodium: 137 mmol/L (ref 135–145)
Total Bilirubin: 1.4 mg/dL — ABNORMAL HIGH (ref 0.0–1.2)
Total Protein: 7.8 g/dL (ref 6.5–8.1)

## 2024-05-09 LAB — LIPASE, BLOOD: Lipase: 31 U/L (ref 11–51)

## 2024-05-09 LAB — PREGNANCY, URINE: Preg Test, Ur: NEGATIVE

## 2024-05-09 MED ORDER — KETOROLAC TROMETHAMINE 15 MG/ML IJ SOLN
15.0000 mg | Freq: Once | INTRAMUSCULAR | Status: AC
  Administered 2024-05-09: 15 mg via INTRAVENOUS
  Filled 2024-05-09: qty 1

## 2024-05-09 MED ORDER — CIPROFLOXACIN HCL 500 MG PO TABS: 500.0000 mg | ORAL_TABLET | Freq: Two times a day (BID) | ORAL | 0 refills | Status: AC

## 2024-05-09 MED ORDER — ONDANSETRON 4 MG PO TBDP: 4.0000 mg | ORAL_TABLET | Freq: Three times a day (TID) | ORAL | 0 refills | Status: AC | PRN

## 2024-05-09 MED ORDER — CIPROFLOXACIN HCL 500 MG PO TABS
500.0000 mg | ORAL_TABLET | Freq: Once | ORAL | Status: AC
  Administered 2024-05-09: 500 mg via ORAL
  Filled 2024-05-09: qty 1

## 2024-05-09 MED ORDER — IOHEXOL 300 MG/ML  SOLN
100.0000 mL | Freq: Once | INTRAMUSCULAR | Status: AC | PRN
  Administered 2024-05-09: 100 mL via INTRAVENOUS

## 2024-05-09 NOTE — ED Triage Notes (Addendum)
 Pt c/o lower abdominal pain 2 days ago, pt had BM today after taking laxatives and c/o increased pain after. Pt c/o low grade fever last night. Pt AOX4, NAD noted. Pt denies v/D, some nausea. Pt does not have appendix.

## 2024-05-09 NOTE — Discharge Instructions (Addendum)
 You are seen in the emergency department for lower abdominal pain.  You had a CT scan with IV contrast that showed inflammation of your colon which could be from an infection.  Follow-up closely with your primary care physician to make sure that your symptoms are improving.  You had an incidental finding of a cyst on your liver that was favored to be benign but needs followed up as an outpatient.  Pain control:  Ibuprofen  (motrin /aleve/advil ) - You can take 3 tablets (600 mg) every 6 hours as needed for pain/fever.  Acetaminophen  (tylenol ) - You can take 2 extra strength tablets (1000 mg) every 6 hours as needed for pain/fever.  You can alternate these medications or take them together.  Make sure you eat food/drink water when taking these medications.  Return to the emergency department if you have ongoing or worsening symptoms.

## 2024-05-09 NOTE — ED Provider Notes (Signed)
 [facility] Provider Note    Event Date/Time   First MD Initiated Contact with Patient 05/09/24 1303     (approximate)   History   Abdominal Pain   HPI  Christine Allison is a 43 y.o. female no significant past medical history presents to the emergency department with abdominal pain.  Patient states that since Saturday she has been having lower abdominal pain to her lower abdomen.  Was having some urinary urgency.  Took an Azo pill yesterday and had some improvement of her dysuria.  Was uncertain if she was constipated she also took a laxative.  Ongoing pain to her lower abdomen to both sides.  Denies any falls or trauma.  Denies fever but does endorse some nausea, no episodes of vomiting.  No blood in her stool.  Denies vaginal discharge.  No prior history of resistant urinary tract infection.  No prior abdominal surgery.  Does endorse regular alcohol use.     Physical Exam   Triage Vital Signs: ED Triage Vitals [05/09/24 1236]  Encounter Vitals Group     BP 130/88     Systolic BP Percentile      Diastolic BP Percentile      Pulse Rate 99     Resp 18     Temp 98.7 F (37.1 C)     Temp Source Oral     SpO2 99 %     Weight 163 lb (73.9 kg)     Height 5\' 7"  (1.702 m)     Head Circumference      Peak Flow      Pain Score 4     Pain Loc      Pain Education      Exclude from Growth Chart     Most recent vital signs: Vitals:   05/09/24 1236  BP: 130/88  Pulse: 99  Resp: 18  Temp: 98.7 F (37.1 C)  SpO2: 99%    Physical Exam Constitutional:      Appearance: She is well-developed.  HENT:     Head: Atraumatic.  Eyes:     Conjunctiva/sclera: Conjunctivae normal.  Cardiovascular:     Rate and Rhythm: Regular rhythm.  Pulmonary:     Effort: No respiratory distress.  Abdominal:     General: There is no distension.     Tenderness: There is abdominal tenderness in the right lower quadrant, suprapubic area and left lower quadrant.   Musculoskeletal:        General: Normal range of motion.     Cervical back: Normal range of motion.  Skin:    General: Skin is warm.  Neurological:     Mental Status: She is alert. Mental status is at baseline.     IMPRESSION / MDM / ASSESSMENT AND PLAN / ED COURSE  I reviewed the triage vital signs and the nursing notes.  Differential diagnosis including constipation, IBD, appendicitis, diverticulitis, intra-abdominal absces.,  Pyelonephritis  Low suspicion for PID, denies any vaginal discharge.   EKG  I, Viviano Ground, the attending physician, personally viewed and interpreted this ECG.   Rate: Normal  Rhythm: Normal sinus  Axis: Normal  Intervals: Normal  ST&T Change: None  No tachycardic or bradycardic dysrhythmias while on cardiac telemetry.  RADIOLOGY I independently reviewed imaging, my interpretation of imaging: CT scan with no findings of intra-abdominal abscess.  Noted findings concerning for colitis.  Cystic lesion in her liver that is favored to be benign.  Discussed incidental finding with  the patient and discussed follow-up as an outpatient.  Stable pulmonary nodule.  LABS (all labs ordered are listed, but only abnormal results are displayed) Labs interpreted as -    Labs Reviewed  COMPREHENSIVE METABOLIC PANEL WITH GFR - Abnormal; Notable for the following components:      Result Value   Glucose, Bld 122 (*)    Total Bilirubin 1.4 (*)    All other components within normal limits  CBC - Abnormal; Notable for the following components:   Hemoglobin 15.5 (*)    MCH 34.8 (*)    RDW 11.4 (*)    All other components within normal limits  URINALYSIS, ROUTINE W REFLEX MICROSCOPIC - Abnormal; Notable for the following components:   Color, Urine YELLOW (*)    APPearance CLEAR (*)    All other components within normal limits  LIPASE, BLOOD  PREGNANCY, URINE  POC URINE PREG, ED     MDM  On reevaluation patient states she is feeling much better.  Pain  is improved.  No episodes of nausea or vomiting.  Given her diarrhea and inflammation of her colon with comfort concerns for colitis we will do a course of antibiotics with ciprofloxacin.  Given a prescription for antiemetics.  Discussed close follow-up with primary care provider.  Discussed incidental findings with the patient.  Lab work reassuring.  No findings of urinary tract infection.  Discussed return if she had any ongoing or worsening symptoms.     PROCEDURES:  Critical Care performed: No  Procedures  Patient's presentation is most consistent with acute complicated illness / injury requiring diagnostic workup.   MEDICATIONS ORDERED IN ED: Medications  ciprofloxacin (CIPRO) tablet 500 mg (has no administration in time range)  ketorolac  (TORADOL ) 15 MG/ML injection 15 mg (15 mg Intravenous Given 05/09/24 1341)  iohexol  (OMNIPAQUE ) 300 MG/ML solution 100 mL (100 mLs Intravenous Contrast Given 05/09/24 1435)    FINAL CLINICAL IMPRESSION(S) / ED DIAGNOSES   Final diagnoses:  Left lower quadrant abdominal pain  Colitis  Liver cyst     Rx / DC Orders   ED Discharge Orders          Ordered    ciprofloxacin (CIPRO) 500 MG tablet  2 times daily        05/09/24 1524    ondansetron  (ZOFRAN -ODT) 4 MG disintegrating tablet  Every 8 hours PRN        05/09/24 1524             Note:  This document was prepared using Dragon voice recognition software and may include unintentional dictation errors.   Viviano Ground, MD 05/09/24 1525

## 2024-05-09 NOTE — ED Notes (Signed)
 See triage notes. Patient c/o lower abdominal pain that began two days ago. Patient stated she was concerned it was urinary related and took a medication for that which did not help. Patient then became concerned that it could be constipation and took a laxative for that. Patient stated when she had a bowel movement today that it caused her stomach to ache. Patient stated she typically has diarrhea not formed stools. NAD
